# Patient Record
Sex: Male | Born: 1975
Health system: Southern US, Community
[De-identification: ages and names within clinical notes are randomized; demographics above are authoritative.]

## PROBLEM LIST (undated history)

## (undated) DIAGNOSIS — Z85850 Personal history of malignant neoplasm of thyroid: Secondary | ICD-10-CM

## (undated) DIAGNOSIS — F988 Other specified behavioral and emotional disorders with onset usually occurring in childhood and adolescence: Secondary | ICD-10-CM

## (undated) DIAGNOSIS — Z87891 Personal history of nicotine dependence: Secondary | ICD-10-CM

## (undated) DIAGNOSIS — E0580 Other thyrotoxicosis without thyrotoxic crisis or storm: Secondary | ICD-10-CM

## (undated) DIAGNOSIS — E781 Pure hyperglyceridemia: Secondary | ICD-10-CM

## (undated) HISTORY — DX: Personal history of nicotine dependence: Z87.891

## (undated) HISTORY — DX: Personal history of malignant neoplasm of thyroid: Z85.850

## (undated) HISTORY — DX: Other specified behavioral and emotional disorders with onset usually occurring in childhood and adolescence: F98.8

## (undated) HISTORY — DX: Other thyrotoxicosis without thyrotoxic crisis or storm: E05.80

---
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
## 2001-11-12 ENCOUNTER — Encounter: Payer: Self-pay | Admitting: Internal Medicine

## 2003-06-14 HISTORY — PX: THYROIDECTOMY: SHX17

## 2005-04-03 ENCOUNTER — Ambulatory Visit: Payer: Self-pay | Admitting: Internal Medicine

## 2005-05-22 ENCOUNTER — Ambulatory Visit: Payer: Self-pay | Admitting: Internal Medicine

## 2005-08-20 ENCOUNTER — Ambulatory Visit: Payer: Self-pay | Admitting: Internal Medicine

## 2006-03-05 ENCOUNTER — Ambulatory Visit: Payer: Self-pay | Admitting: Internal Medicine

## 2007-10-16 ENCOUNTER — Ambulatory Visit: Payer: Self-pay | Admitting: Internal Medicine

## 2007-10-16 DIAGNOSIS — Z85850 Personal history of malignant neoplasm of thyroid: Secondary | ICD-10-CM

## 2007-10-16 DIAGNOSIS — E039 Hypothyroidism, unspecified: Secondary | ICD-10-CM

## 2007-10-16 DIAGNOSIS — F988 Other specified behavioral and emotional disorders with onset usually occurring in childhood and adolescence: Secondary | ICD-10-CM

## 2007-12-02 ENCOUNTER — Ambulatory Visit: Payer: Self-pay | Admitting: Internal Medicine

## 2008-01-14 ENCOUNTER — Ambulatory Visit: Payer: Self-pay | Admitting: Internal Medicine

## 2008-03-29 ENCOUNTER — Ambulatory Visit: Payer: Self-pay | Admitting: Internal Medicine

## 2008-06-23 ENCOUNTER — Encounter: Payer: Self-pay | Admitting: Internal Medicine

## 2008-06-28 ENCOUNTER — Ambulatory Visit: Payer: Self-pay | Admitting: Internal Medicine

## 2008-07-28 ENCOUNTER — Ambulatory Visit: Payer: Self-pay | Admitting: Internal Medicine

## 2008-07-28 DIAGNOSIS — J069 Acute upper respiratory infection, unspecified: Secondary | ICD-10-CM | POA: Insufficient documentation

## 2008-10-08 ENCOUNTER — Ambulatory Visit: Payer: Self-pay | Admitting: Internal Medicine

## 2009-01-12 ENCOUNTER — Ambulatory Visit: Payer: Self-pay | Admitting: Internal Medicine

## 2009-01-13 ENCOUNTER — Telehealth (INDEPENDENT_AMBULATORY_CARE_PROVIDER_SITE_OTHER): Payer: Self-pay | Admitting: *Deleted

## 2009-04-14 ENCOUNTER — Ambulatory Visit: Payer: Self-pay | Admitting: Internal Medicine

## 2009-06-23 ENCOUNTER — Encounter: Payer: Self-pay | Admitting: Internal Medicine

## 2009-06-25 ENCOUNTER — Encounter: Payer: Self-pay | Admitting: Internal Medicine

## 2009-07-22 ENCOUNTER — Ambulatory Visit: Payer: Self-pay | Admitting: Internal Medicine

## 2009-08-22 ENCOUNTER — Telehealth: Payer: Self-pay | Admitting: Internal Medicine

## 2009-10-20 ENCOUNTER — Ambulatory Visit: Payer: Self-pay | Admitting: Internal Medicine

## 2009-10-21 ENCOUNTER — Telehealth: Payer: Self-pay | Admitting: Internal Medicine

## 2010-01-23 ENCOUNTER — Ambulatory Visit: Payer: Self-pay | Admitting: Internal Medicine

## 2010-03-27 ENCOUNTER — Ambulatory Visit: Payer: Self-pay | Admitting: Internal Medicine

## 2010-03-27 DIAGNOSIS — L0292 Furuncle, unspecified: Secondary | ICD-10-CM | POA: Insufficient documentation

## 2010-03-27 DIAGNOSIS — L0293 Carbuncle, unspecified: Secondary | ICD-10-CM

## 2010-06-27 ENCOUNTER — Ambulatory Visit: Payer: Self-pay | Admitting: Internal Medicine

## 2010-06-30 ENCOUNTER — Ambulatory Visit: Payer: Self-pay | Admitting: Diagnostic Radiology

## 2010-06-30 ENCOUNTER — Ambulatory Visit (HOSPITAL_BASED_OUTPATIENT_CLINIC_OR_DEPARTMENT_OTHER): Admission: RE | Admit: 2010-06-30 | Discharge: 2010-06-30 | Payer: Self-pay | Admitting: Internal Medicine

## 2010-06-30 ENCOUNTER — Encounter: Payer: Self-pay | Admitting: Internal Medicine

## 2010-06-30 LAB — CONVERTED CEMR LAB: Thyroglobulin Ab: <20 (ref <40.0)

## 2010-07-04 ENCOUNTER — Telehealth: Payer: Self-pay | Admitting: Internal Medicine

## 2010-09-12 NOTE — Assessment & Plan Note (Signed)
Summary: 3 month follow up/mhf   Vital Signs:  Patient profile:   35 year old male Height:      69 inches Weight:      190.75 pounds BMI:     28.27 O2 Sat:      99 % on Room air Temp:     98.2 degrees F oral Pulse rate:   67 / minute Resp:     16 per minute BP sitting:   114 / 70  (right arm) Cuff size:   large  Vitals Entered By: Glendell Docker CMA (June 27, 2010 8:04 AM)  O2 Flow:  Room air CC: 3 Month follow up Is Patient Diabetic? No Pain Assessment Patient in pain? no      Comments no concerns, refills on Adderral   Primary Care Provider:  DThomos Lemons DO  CC:  3 Month follow up.  History of Present Illness: 35 y/o male for ADD f/u no change  hx of thyroid ca - never established with Dr Allena Katz  pt due for surveillance no neck symptoms  Preventive Screening-Counseling & Management  Alcohol-Tobacco     Smoking Status: quit  Allergies (verified): No Known Drug Allergies  Past History:  Past Medical History: History of papillary thyroid cancer - S/P thyroidectomy 06/2003 and ablation Iatrogenic hypothyroidism   History of tobacco use.     ADD           Past Surgical History: Thyroidectomy 06/2003  Tonsillectomy 1985               Family History: Mother is age 29 - healthy Father is age 34 - achalasia CAD in paternal grandmother.           Social History: Occupation: Corporate investment banker - [employer]  Married 2 daughters ages 4 and 2   Former Smoker quit 7 yrs ago.  3 to 5 pack year history Alcohol use-no               Physical Exam  General:  alert, well-developed, and well-nourished.   Neck:  supple and no masses.   Lungs:  normal respiratory effort and normal breath sounds.   Heart:  normal rate, regular rhythm, and no gallop.     Impression & Recommendations:  Problem # 1:  ADD (ICD-314.00) Assessment Unchanged Maintain current medication regimen.  Problem # 2:  HYPOTHYROIDISM, IATROGENIC  (ICD-244.3)  His updated medication list for this problem includes:    Levothyroxine Sodium 150 Mcg Tabs (Levothyroxine sodium) ..... One by mouth once daily  Orders: T-TSH [phone]) T- * Misc. Laboratory test [phone]) Radiology Referral (Radiology)  Complete Medication List: 1)  Levothyroxine Sodium 150 Mcg Tabs (Levothyroxine sodium) .... One by mouth once daily 2)  Adderall Xr 20 Mg Xr24h-cap (Amphetamine-dextroamphetamine) .... One by mouth once daily 3)  Adderall Xr 20 Mg Xr24h-cap (Amphetamine-dextroamphetamine) .... One by mouth once daily (fill on or after 07/27/2010) 4)  Adderall Xr 20 Mg Xr24h-cap (Amphetamine-dextroamphetamine) .... One by mouth once daily (fill on or after 08/27/10)  Patient Instructions: 1)  Please schedule a follow-up appointment in 3 months (nurse visit) Prescriptions: LEVOTHYROXINE SODIUM 150 MCG TABS (LEVOTHYROXINE SODIUM) one by mouth once daily  #90 Tablet x 1   Entered and Authorized by:   D. Thomos Lemons DO   Signed by:   D. Thomos Lemons DO on 06/27/2010   Method used:   Electronically to        [facility]  [address] [phone][facility] (  retail)       [address] [place], [place]  [address]       Ph: [phone] or [phone]       Fax: [phone]   RxID:   [id number] ADDERALL XR 20 MG XR24H-CAP (AMPHETAMINE-DEXTROAMPHETAMINE) one by mouth once daily (fill on or after 08/27/10)  #30 x 0   Entered and Authorized by:   D. Thomos Lemons DO   Signed by:   D. Thomos Lemons DO on 06/27/2010   Method used:   Print then Give to Patient   RxID:   [id number] ADDERALL XR 20 MG XR24H-CAP (AMPHETAMINE-DEXTROAMPHETAMINE) one by mouth once daily (fill on or after 07/27/2010)  #30 x 0   Entered and Authorized by:   D. Thomos Lemons DO   Signed by:   D. Thomos Lemons DO on 06/27/2010   Method used:   Print then Give to Patient   RxID:   [id number] ADDERALL XR 20 MG XR24H-CAP (AMPHETAMINE-DEXTROAMPHETAMINE) one by mouth once daily  #30 x 0   Entered  and Authorized by:   D. Thomos Lemons DO   Signed by:   D. Thomos Lemons DO on 06/27/2010   Method used:   Print then Give to Patient   RxID:   [id number]    Orders Added: 1)  T-TSH [[id number]] 2)  T- * Misc. Laboratory test [phone] 3)  Radiology Referral [Radiology] 4)  Est. Patient Level III [[address]]     Current Allergies (reviewed today): No known allergies

## 2010-09-12 NOTE — Progress Notes (Signed)
Summary: lost his Adderall Rx's. and needs to pick up TODAY  Phone Note Refill Request    Follow-up for Phone Call        Pt. has lost his  Adderall prescriptions that Dr.Yoo gave him while he was here. He states he lost the ones from this month forward. Pt. would like to pick up a reprint today from our office before the weather gets bad, if you will call patient and let him know the status on this. Thank you [phone] Follow-up by: Michaelle Copas,  August 22, 2009 12:02 PM  Additional Follow-up for Phone Call Additional follow up Details #1::        please reprint for signature Additional Follow-up by: D. Thomos Lemons DO,  August 22, 2009 1:17 PM    Additional Follow-up for Phone Call Additional follow up Details #2::    rx printed x2 signed and left at fornt desk for patient pick up. Patient is aware Follow-up by: Glendell Docker CMA,  August 22, 2009 4:07 PM  Prescriptions: ADDERALL XR 20 MG XR24H-CAP (AMPHETAMINE-DEXTROAMPHETAMINE) one by mouth once daily (fill on or after 09/22/08)  #30 x 0   Entered by:   Glendell Docker CMA   Authorized by:   D. Thomos Lemons DO   Signed by:   Glendell Docker CMA on 08/22/2009   Method used:   Reprint   RxID:   [id number] ADDERALL XR 20 MG XR24H-CAP (AMPHETAMINE-DEXTROAMPHETAMINE) one by mouth once daily (fill on or after 08/22/2009)  #30 x 0   Entered by:   Glendell Docker CMA   Authorized by:   D. Thomos Lemons DO   Signed by:   Glendell Docker CMA on 08/22/2009   Method used:   Reprint   RxID:   [id number] ADDERALL XR 20 MG  CP24 (AMPHETAMINE-DEXTROAMPHETAMINE) one by mouth once daily  #30 x 0   Entered by:   Glendell Docker CMA   Authorized by:   D. Thomos Lemons DO   Signed by:   Glendell Docker CMA on 08/22/2009   Method used:   Reprint   RxID:   [id number]

## 2010-09-12 NOTE — Progress Notes (Signed)
Summary: lab result  Phone Note Outgoing Call   Summary of Call: call pt - thyroid u/s - no change  thyroid blood tests normal.  continue same dose of thyroid medication arrange repeat TSH in 6 months Initial call taken by: D. Thomos Lemons DO,  July 04, 2010 5:26 PM  Follow-up for Phone Call        Pt notified and voices understanding. Lab order placed and sent to the lab for the week of 12/24/09. Appt reminder and lab hours mailed to pt. Nicki Guadalajara Fergerson CMA Duncan Dull)  July 05, 2010 8:33 AM

## 2010-09-12 NOTE — Progress Notes (Signed)
Summary: Lab Results  Phone Note Outgoing Call   Summary of Call: call pt - TSH normal.  continue same dose of thyroid medication.  Plan is to repeat TSH in 6 months Initial call taken by: D. Thomos Lemons DO,  October 21, 2009 9:34 AM  Follow-up for Phone Call        patient advised per Dr Artist Pais instructions, request 24 to [facility] Follow-up by: Glendell Docker CMA,  October 21, 2009 9:39 AM    Prescriptions: LEVOTHYROXINE SODIUM 150 MCG TABS (LEVOTHYROXINE SODIUM) one by mouth once daily Brand medically necessary #90 x 1   Entered by:   Glendell Docker CMA   Authorized by:   D. Thomos Lemons DO   Signed by:   Glendell Docker CMA on 10/21/2009   Method used:   Electronically to        [facility]* (mail-order)             ,          Ph: [phone]       Fax: [phone]   RxID:   [id number]

## 2010-09-12 NOTE — Assessment & Plan Note (Signed)
Summary: 78mo. f/u - jr   Vital Signs:  Patient profile:   35 year old male Height:      69 inches Weight:      194.75 pounds BMI:     28.86 O2 Sat:      98 % on Room air Temp:     98.5 degrees F oral Pulse rate:   66 / minute Pulse rhythm:   regular Resp:     18 per minute BP sitting:   100 / 70  (right arm) Cuff size:   98.5large  Vitals Entered By: Glendell Docker CMA (October 20, 2009 8:07 AM)  O2 Flow:  Room air CC: Rm 3-  3 month follow up  Comments no concerns medication refill   Primary Care Provider:  DThomos Lemons DO  CC:  Rm 3-  3 month follow up .  History of Present Illness: 35 y/o white male for follow up. overall doing well no wt change no behavioral change medication not wearing off  Allergies (verified): No Known Drug Allergies  Past History:  Past Medical History: History of papillary thyroid cancer - S/P thyroidectomy 06/2003 and ablation Iatrogenic hypothyroidism History of tobacco use.   ADD          Past Surgical History: Thyroidectomy 06/2003  Tonsillectomy 1985          Family History: Mother is age 24 - healthy Father is age 3 - achalasia CAD in paternal grandmother.       Social History: Occupation: Corporate investment banker for [employer] Married 2 daughters ages 58 and 2   Former Smoker quit 7 yrs ago.  3 to 5 pack year history Alcohol use-no          Physical Exam  General:  alert, well-developed, and well-nourished.   Lungs:  normal respiratory effort and normal breath sounds.   Heart:  normal rate, regular rhythm, and no gallop.   Extremities:  No lower extremity edema    Impression & Recommendations:  Problem # 1:  ADD (ICD-314.00)  stable.  Maintain current medication regimen.  Problem # 2:  HYPOTHYROIDISM, IATROGENIC (ICD-244.3) check TSH.    His updated medication list for this problem includes:    Levothyroxine Sodium 150 Mcg Tabs (Levothyroxine sodium) ..... One by mouth once  daily  Orders: T-TSH [phone])  Problem # 3:  THYROID CANCER, HX OF (ICD-V10.87) History of papillary thyroid cancer - S/P thyroidectomy 06/2003 and ablation.  Prev followed by Dr. Roanna Raider.  refer to Dr. Allena Katz Orders: Endocrinology Referral (Endocrine)  Complete Medication List: 1)  Levothyroxine Sodium 150 Mcg Tabs (Levothyroxine sodium) .... One by mouth once daily 2)  Adderall Xr 20 Mg Cp24 (Amphetamine-dextroamphetamine) .... One by mouth once daily 3)  Adderall Xr 20 Mg Xr24h-cap (Amphetamine-dextroamphetamine) .... One by mouth once daily (fill on or after 11/20/2009) 4)  Adderall Xr 20 Mg Xr24h-cap (Amphetamine-dextroamphetamine) .... One by mouth once daily (fill on or after 12/20/08)  Patient Instructions: 1)  Please schedule a follow-up appointment in 3 months. Prescriptions: ADDERALL XR 20 MG XR24H-CAP (AMPHETAMINE-DEXTROAMPHETAMINE) one by mouth once daily (fill on or after 12/20/08)  #30 x 0   Entered and Authorized by:   D. Thomos Lemons DO   Signed by:   D. Thomos Lemons DO on 10/20/2009   Method used:   Print then Give to Patient   RxID:   [id number] ADDERALL XR 20 MG XR24H-CAP (AMPHETAMINE-DEXTROAMPHETAMINE) one by mouth once daily (fill on or after 11/20/2009)  #  30 x 0   Entered and Authorized by:   D. Thomos Lemons DO   Signed by:   D. Thomos Lemons DO on 10/20/2009   Method used:   Print then Give to Patient   RxID:   [id number] ADDERALL XR 20 MG  CP24 (AMPHETAMINE-DEXTROAMPHETAMINE) one by mouth once daily  #30 x 0   Entered and Authorized by:   D. Thomos Lemons DO   Signed by:   D. Thomos Lemons DO on 10/20/2009   Method used:   Print then Give to Patient   RxID:   [id number]   Current Allergies (reviewed today): No known allergies

## 2010-09-12 NOTE — Assessment & Plan Note (Signed)
Summary: place on left thigh/dt   Vital Signs:  Patient profile:   35 year old male Height:      69 inches Weight:      198 pounds BMI:     29.35 O2 Sat:      100 % on Room air Temp:     98.4 degrees F oral Pulse rate:   52 / minute Pulse rhythm:   regular Resp:     16 per minute BP sitting:   110 / 70  (right arm) Cuff size:   large  Vitals Entered By: Glendell Docker CMA (March 27, 2010 3:53 PM)  O2 Flow:  Room air CC: Bump inner left thigh Is Patient Diabetic? No Pain Assessment Patient in pain? no        Primary Care Provider:  Dondra Spry DO  CC:  Bump inner left thigh.  History of Present Illness: 35 y/o white male with hx of ADD c/o of bump on inner right thigh, noticed this morning, denies pain, irritated from clothing, no drainage.  no fever  ADD - stable.  no change  Preventive Screening-Counseling & Management  Alcohol-Tobacco     Smoking Status: quit  Allergies (verified): No Known Drug Allergies  Past History:  Past Medical History: History of papillary thyroid cancer - S/P thyroidectomy 06/2003 and ablation Iatrogenic hypothyroidism   History of tobacco use.    ADD           Past Surgical History: Thyroidectomy 06/2003  Tonsillectomy 1985              Social History: Smoking Status:  quit  Physical Exam  General:  alert, well-developed, and well-nourished.   Lungs:  normal respiratory effort and normal breath sounds.   Heart:  normal rate, regular rhythm, and no gallop.   Skin:  1 cm boil on right upper inner thigh   Impression & Recommendations:  Problem # 1:  CARBUNCLE AND FURUNCLE OF UNSPECIFIED SITE (ICD-680.9) 1 cm boil right upper thigh.  not ready for I & D.  use warm compress and abx.  doxy 100 mg two times a day x 10 days try to encourage spontaneous drainage. Patient advised to call office if symptoms persist or worsen.  I & D may be necessary  Problem # 2:  ADD (ICD-314.00) Assessment: Unchanged  Complete  Medication List: 1)  Levothyroxine Sodium 150 Mcg Tabs (Levothyroxine sodium) .... One by mouth once daily 2)  Adderall Xr 20 Mg Xr24h-cap (Amphetamine-dextroamphetamine) .... One by mouth once daily 3)  Adderall Xr 20 Mg Xr24h-cap (Amphetamine-dextroamphetamine) .... One by mouth once daily (fill on or after 04/27/2010) 4)  Adderall Xr 20 Mg Xr24h-cap (Amphetamine-dextroamphetamine) .... One by mouth once daily (fill on or after 05/27/10) 5)  Doxycycline Hyclate 100 Mg Tabs (Doxycycline hyclate) .... One by mouth two times a day  Patient Instructions: 1)  Please schedule a follow-up appointment in 3 months. Prescriptions: ADDERALL XR 20 MG XR24H-CAP (AMPHETAMINE-DEXTROAMPHETAMINE) one by mouth once daily (fill on or after 05/27/10)  #30 x 0   Entered and Authorized by:   D. Thomos Lemons DO   Signed by:   D. Thomos Lemons DO on 03/27/2010   Method used:   Print then Give to Patient   RxID:   [id number] ADDERALL XR 20 MG XR24H-CAP (AMPHETAMINE-DEXTROAMPHETAMINE) one by mouth once daily (fill on or after 04/27/2010)  #30 x 0   Entered and Authorized by:   D. Thomos Lemons DO   Signed  by:   Dondra Spry DO on 03/27/2010   Method used:   Print then Give to Patient   RxID:   [id number] ADDERALL XR 20 MG XR24H-CAP (AMPHETAMINE-DEXTROAMPHETAMINE) one by mouth once daily  #30 x 0   Entered and Authorized by:   D. Thomos Lemons DO   Signed by:   D. Thomos Lemons DO on 03/27/2010   Method used:   Print then Give to Patient   RxID:   [id number] DOXYCYCLINE HYCLATE 100 MG TABS (DOXYCYCLINE HYCLATE) one by mouth two times a day  #20 x 0   Entered and Authorized by:   D. Thomos Lemons DO   Signed by:   D. Thomos Lemons DO on 03/27/2010   Method used:   [facility]        [facility]  [address] [id number]       [address]       [place], [place]  [address]       Ph: [phone] or [phone]       Fax: [phone]   RxID:   [id number]   Current Allergies (reviewed today): No  known allergies

## 2010-09-12 NOTE — Assessment & Plan Note (Signed)
Summary: ADDERALL REFILL/MHF--Rm 2   Vital Signs:  Patient profile:   35 year old male Height:      69 inches Weight:      197.50 pounds BMI:     29.27 Temp:     98.2 degrees F oral Pulse rate:   56 / minute Pulse rhythm:   regular Resp:     18 per minute BP sitting:   110 / 78  (right arm) Cuff size:   regular  Vitals Entered By: Mervin Kung CMA (January 23, 2010 3:54 PM) CC: Room 2  Follow up for management of ADD. Is Patient Diabetic? No   Primary Care Provider:  Dondra Spry DO  CC:  Room 2  Follow up for management of ADD.Marland Kitchen  History of Present Illness: 35 y/o for ADD no sign change doing well  he has not est with with endo re:  hx of thyroid cancer   Allergies (verified): No Known Drug Allergies  Past History:  Past Medical History: History of papillary thyroid cancer - S/P thyroidectomy 06/2003 and ablation Iatrogenic hypothyroidism  History of tobacco use.    ADD           Past Surgical History: Thyroidectomy 06/2003  Tonsillectomy 1985             Family History: Mother is age 2 - healthy Father is age 20 - achalasia CAD in paternal grandmother.          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  3 to 5 pack year history Alcohol use-no              Physical Exam  General:  alert, well-developed, and well-nourished.   Neck:  supple and no masses.   Lungs:  normal respiratory effort and normal breath sounds.   Heart:  normal rate, regular rhythm, and no gallop.   Neurologic:  cranial nerves II-XII intact and gait normal.   Psych:  normally interactive and good eye contact.     Impression & Recommendations:  Problem # 1:  ADD (ICD-314.00) Assessment Unchanged stable.  Maintain current medication regimen.  Problem # 2:  HYPOTHYROIDISM, IATROGENIC (ICD-244.3) follow TFTs, thyroglobulin and thyroid u/s until pt est with new endo His  updated medication list for this problem includes:    Levothyroxine Sodium 150 Mcg Tabs (Levothyroxine sodium) ..... One by mouth once daily  Labs Reviewed: TSH: 0.635 (10/20/2009)     Complete Medication List: 1)  Levothyroxine Sodium 150 Mcg Tabs (Levothyroxine sodium) .... One by mouth once daily 2)  Adderall Xr 20 Mg Xr24h-cap (Amphetamine-dextroamphetamine) .... One by mouth once daily 3)  Adderall Xr 20 Mg Xr24h-cap (Amphetamine-dextroamphetamine) .... One by mouth once daily (fill on or after 02/22/2010) 4)  Adderall Xr 20 Mg Xr24h-cap (Amphetamine-dextroamphetamine) .... One by mouth once daily (fill on or after 03/25/10)  Patient Instructions: 1)  Please schedule a follow-up appointment in 3 months. 2)  TSH:  244.9 3)  Thyroglobulin level:  V10.87 4)  Please return for lab work one (1) week before your next appointment.  Prescriptions: ADDERALL XR 20 MG XR24H-CAP (AMPHETAMINE-DEXTROAMPHETAMINE) one by mouth once daily (fill on or after 03/25/10)  #30 x 0   Entered and Authorized by:   D. Thomos Lemons DO   Signed by:   D. Thomos Lemons DO on 01/23/2010   Method used:   Print then Give to  Patient   RxID:   [id number] ADDERALL XR 20 MG XR24H-CAP (AMPHETAMINE-DEXTROAMPHETAMINE) one by mouth once daily (fill on or after 02/22/2010)  #30 x 0   Entered and Authorized by:   D. Thomos Lemons DO   Signed by:   D. Thomos Lemons DO on 01/23/2010   Method used:   Print then Give to Patient   RxID:   [id number] ADDERALL XR 20 MG XR24H-CAP (AMPHETAMINE-DEXTROAMPHETAMINE) one by mouth once daily  #30 x 0   Entered and Authorized by:   D. Thomos Lemons DO   Signed by:   D. Thomos Lemons DO on 01/23/2010   Method used:   Print then Give to Patient   RxID:   [id number]   Current Allergies (reviewed today): No known allergies

## 2010-10-04 ENCOUNTER — Ambulatory Visit (INDEPENDENT_AMBULATORY_CARE_PROVIDER_SITE_OTHER): Payer: Private Health Insurance - Indemnity

## 2010-10-04 ENCOUNTER — Encounter: Payer: Self-pay | Admitting: Internal Medicine

## 2010-10-10 NOTE — Assessment & Plan Note (Signed)
Summary: 3 mth follow up- per pt instructions in emr/ss   Vital Signs:  Patient profile:   35 year old male Height:      69 inches Weight:      196.75 pounds BMI:     29.16 Temp:     97.9 degrees F oral Pulse rate:   54 / minute Resp:     18 per minute BP sitting:   130 / 80  (left arm) Cuff size:   large  Vitals Entered By: Glendell Docker CMA (October 04, 2010 9:03 AM) CC: Medication Refill   CC:  Medication Refill.  Allergies: No Known Drug Allergies   Complete Medication List: 1)  Levothyroxine Sodium 150 Mcg Tabs (Levothyroxine sodium) .... One by mouth once daily 2)  Adderall Xr 20 Mg Xr24h-cap (Amphetamine-dextroamphetamine) .... Take 1 tablet by mouth once a day 3)  Adderall Xr 20 Mg Xr24h-cap (Amphetamine-dextroamphetamine) .... One by mouth once daily (fill on or after 11/02/2010) 4)  Adderall Xr 20 Mg Xr24h-cap (Amphetamine-dextroamphetamine) .... One by mouth once daily (fill on or after 12/03/10) Prescriptions: ADDERALL XR 20 MG XR24H-CAP (AMPHETAMINE-DEXTROAMPHETAMINE) one by mouth once daily (fill on or after 11/02/2010)  #30 x 0   Entered by:   Glendell Docker CMA   Authorized by:   D. Thomos Lemons DO   Signed by:   Glendell Docker CMA on 10/04/2010   Method used:   Print then Give to Patient   RxID:   [id number] ADDERALL XR 20 MG XR24H-CAP (AMPHETAMINE-DEXTROAMPHETAMINE) one by mouth once daily (fill on or after 12/03/10)  #30 x 0   Entered by:   Glendell Docker CMA   Authorized by:   D. Thomos Lemons DO   Signed by:   Glendell Docker CMA on 10/04/2010   Method used:   Print then Give to Patient   RxID:   [id number] ADDERALL XR 20 MG XR24H-CAP (AMPHETAMINE-DEXTROAMPHETAMINE) Take 1 tablet by mouth once a day  #30 x 0   Entered by:   Glendell Docker CMA   Authorized by:   D. Thomos Lemons DO   Signed by:   Glendell Docker CMA on 10/04/2010   Method used:   Print then Give to Patient   RxID:   [id number]

## 2010-10-23 ENCOUNTER — Telehealth: Payer: Self-pay | Admitting: Internal Medicine

## 2010-10-31 NOTE — Progress Notes (Signed)
Summary: refill-synthroid  Phone Note Refill Request Message from:  Fax from Pharmacy on October 23, 2010 2:59 PM  Refills Requested: Medication #1:  synthroid 150 mcg   Brand Name Necessary? No   Supply Requested: [facility] [address] [place], [address] fax [phone]   Method Requested: Electronic Next Appointment Scheduled: 5.3.12 Initial call taken by: Elba Barman,  October 23, 2010 3:00 PM  Follow-up for Phone Call        Rx completed in Dr. Tiajuana Amass Follow-up by: Glendell Docker CMA,  October 23, 2010 3:37 PM    Prescriptions: LEVOTHYROXINE SODIUM 150 MCG TABS (LEVOTHYROXINE SODIUM) one by mouth once daily  #30 x 1   Entered by:   Glendell Docker CMA   Authorized by:   D. Thomos Lemons DO   Signed by:   Glendell Docker CMA on 10/23/2010   Method used:   Electronically to        [facility] [phone]* (retail)       [address] [place], [place]  [address]       Ph: [phone]       Fax: [phone]   RxID:   [id number]

## 2010-12-13 ENCOUNTER — Encounter: Payer: Self-pay | Admitting: Internal Medicine

## 2010-12-14 ENCOUNTER — Encounter: Payer: Self-pay | Admitting: Internal Medicine

## 2010-12-14 ENCOUNTER — Ambulatory Visit (INDEPENDENT_AMBULATORY_CARE_PROVIDER_SITE_OTHER): Payer: Private Health Insurance - Indemnity | Admitting: Internal Medicine

## 2010-12-14 DIAGNOSIS — E032 Hypothyroidism due to medicaments and other exogenous substances: Secondary | ICD-10-CM

## 2010-12-14 DIAGNOSIS — F988 Other specified behavioral and emotional disorders with onset usually occurring in childhood and adolescence: Secondary | ICD-10-CM

## 2010-12-14 DIAGNOSIS — Z85850 Personal history of malignant neoplasm of thyroid: Secondary | ICD-10-CM

## 2010-12-14 MED ORDER — AMPHETAMINE-DEXTROAMPHET ER 20 MG PO CP24: ORAL_CAPSULE | ORAL | Status: DC

## 2010-12-14 MED ORDER — AMPHETAMINE-DEXTROAMPHET ER 20 MG PO CP24: 20.0000 mg | ORAL_CAPSULE | Freq: Every day | ORAL | Status: DC

## 2010-12-14 MED ORDER — LEVOTHYROXINE SODIUM 150 MCG PO TABS: 150.0000 ug | ORAL_TABLET | Freq: Every day | ORAL | Status: DC

## 2010-12-14 NOTE — Progress Notes (Signed)
  Subjective:    Patient ID: Zachary [name], male    DOB: 29-Feb-1976, 35 y.o.   MRN: [id number]  HPI  35 y/o white male for follow up re:  ADD No wt change No issues with medication  Hx of thyroid cancer - has not established with Dr. Allena Katz Pt due for TFTs    Review of Systems  Past Medical History  Diagnosis Date  . History of papillary adenocarcinoma of thyroid     s/p thyroidectomy 06/2003  . Iatrogenic hyperthyroidism   . History of tobacco abuse   . ADD (attention deficit disorder)     History   Social History  . Marital Status: Married    Spouse Name: N/A    Number of Children: N/A  . Years of Education: N/A   Occupational History  . Not on file.   Social History Main Topics  . Smoking status: Former Games developer  . Smokeless tobacco: Not on file   Comment: Quit  about 7 years ago. 3-5 pack year history  . Alcohol Use: No  . Drug Use: Not on file  . Sexually Active: Not on file   Other Topics Concern  . 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  3 to 5 pack year historyAlcohol use-no          Past Surgical History  Procedure Date  . Thyroidectomy 06/2003  . Tonsillectomy 1985    Family History  Problem Relation Age of Onset  . Achalasia Father 11  . Coronary artery disease Paternal Grandmother   . Other Mother 24    healthy    No Known Allergies  Current Outpatient Prescriptions on File Prior to Visit  Medication Sig Dispense Refill  . DISCONTD: amphetamine-dextroamphetamine (ADDERALL XR) 20 MG 24 hr capsule Take 20 mg by mouth daily.        Marland Kitchen DISCONTD: amphetamine-dextroamphetamine (ADDERALL XR) 20 MG 24 hr capsule Take 20 mg by mouth daily.        Marland Kitchen DISCONTD: amphetamine-dextroamphetamine (ADDERALL XR) 20 MG 24 hr capsule Take 20 mg by mouth daily.        Marland Kitchen DISCONTD: levothyroxine (SYNTHROID, LEVOTHROID) 150 MCG tablet Take  150 mcg by mouth daily.          BP 100/70  Pulse 55  Temp(Src) 98.3 F (36.8 C) (Oral)  Resp 16  Wt 195 lb (88.451 kg)  SpO2 100%       Objective:   Physical Exam  Constitutional: He appears well-developed and well-nourished.  Cardiovascular: Normal rate, regular rhythm and normal heart sounds.   Pulmonary/Chest: Effort normal. He has no rales.  Psychiatric: He has a normal mood and affect. His behavior is normal.          Assessment & Plan:

## 2010-12-14 NOTE — Assessment & Plan Note (Signed)
Monitor TFTS and thyroglobulin level

## 2010-12-14 NOTE — Assessment & Plan Note (Signed)
No change in meds Wt and BP stable.

## 2010-12-14 NOTE — Patient Instructions (Addendum)
Please complete the following lab tests before your next follow up appointment: TSH,  Thyroglobulin level - 244.9,  V10.87

## 2011-03-15 ENCOUNTER — Telehealth: Payer: Self-pay | Admitting: *Deleted

## 2011-03-15 ENCOUNTER — Telehealth: Payer: Self-pay | Admitting: Internal Medicine

## 2011-03-15 DIAGNOSIS — F988 Other specified behavioral and emotional disorders with onset usually occurring in childhood and adolescence: Secondary | ICD-10-CM

## 2011-03-15 MED ORDER — AMPHETAMINE-DEXTROAMPHET ER 20 MG PO CP24: 20.0000 mg | ORAL_CAPSULE | ORAL | Status: DC

## 2011-03-15 NOTE — Telephone Encounter (Signed)
Left detailed message for pt to pick up rx at front desk and notified Boneta Lucks at [facility] that pt will need to bring in a hard script.

## 2011-03-15 NOTE — Telephone Encounter (Signed)
Rx printed and forwarded to Provider for signature. 

## 2011-03-15 NOTE — Telephone Encounter (Signed)
Pt is out and needs to pick up refill today. Please advise.

## 2011-03-15 NOTE — Telephone Encounter (Signed)
See previous phone note of 03/15/11.

## 2011-03-15 NOTE — Telephone Encounter (Signed)
Pt received Adderall XR rxs from [place] on 12/14/10 through 02/2011. Received message from [facility] pharmacy that they will be unable to fill rx that says to fill on or after 03/05/11 because there were no specific directions listed on the Rx.  He states that they can fill the Rx if the doctor talks to the pharmacist himself and verifies directions or we will need to re-issue the prescription to the pt and have him pick up a new one with specific directions. If you would like to speak to the pharmacist, please ask for Boneta Lucks. Please advise.

## 2011-03-15 NOTE — Telephone Encounter (Signed)
Ok to print

## 2011-03-15 NOTE — Telephone Encounter (Signed)
Refill- amphetamine-dextroamphetamine oral capsule extended release 24 hr 20mg . Take one capsule by mouth daily. Qty 30.   Comments: patient has prescription for generic adderall xr 20mg  from Dr. Artist Pais written 5.3.12. There are no directions on the hard copy and therefore can't be filled as written. Due to the class of medication, directions can be added if verbally given by the prescribing physician to the filling pharmacist(law prohibits representatives of either). If the prescriber is unable to give them verbally, then a new prescription will need to be given to the patient with correct directions. Patient is out of the medication and needs it today.

## 2011-03-16 ENCOUNTER — Ambulatory Visit: Payer: Private Health Insurance - Indemnity | Admitting: Internal Medicine

## 2011-03-19 ENCOUNTER — Ambulatory Visit: Payer: Private Health Insurance - Indemnity | Admitting: Family

## 2011-03-23 ENCOUNTER — Ambulatory Visit: Payer: Private Health Insurance - Indemnity | Admitting: Family

## 2011-03-23 ENCOUNTER — Ambulatory Visit (INDEPENDENT_AMBULATORY_CARE_PROVIDER_SITE_OTHER): Payer: Private Health Insurance - Indemnity | Admitting: Family

## 2011-03-23 ENCOUNTER — Encounter: Payer: Self-pay | Admitting: Family

## 2011-03-23 VITALS — BP 124/82 | HR 66 | Temp 97.9°F | Resp 16 | Ht 69.02 in | Wt 192.1 lb

## 2011-03-23 DIAGNOSIS — E039 Hypothyroidism, unspecified: Secondary | ICD-10-CM

## 2011-03-23 DIAGNOSIS — Z85850 Personal history of malignant neoplasm of thyroid: Secondary | ICD-10-CM

## 2011-03-23 DIAGNOSIS — F988 Other specified behavioral and emotional disorders with onset usually occurring in childhood and adolescence: Secondary | ICD-10-CM

## 2011-03-23 MED ORDER — AMPHETAMINE-DEXTROAMPHET ER 20 MG PO CP24: ORAL_CAPSULE | ORAL | Status: DC

## 2011-03-23 NOTE — Progress Notes (Signed)
  Subjective:    Patient ID: Zachary [name], male    DOB: February 18, 1976, 35 y.o.   MRN: [id number]  HPI  Zachary [name] is a 35 yr old male who presents today for follow up.    ADD-  Has been treated for the last few years with Adderal, feels that this is helping.  Thyroid cancer- this was diagnosed in 2004.  S/p thyroidectomy.  He has seen Endocrinology in the past - last visit was 1 yr ago.      Review of Systems    see HPI  Past Medical History  Diagnosis Date  . History of papillary adenocarcinoma of thyroid     s/p thyroidectomy 06/2003  . Iatrogenic hyperthyroidism   . History of tobacco abuse   . ADD (attention deficit disorder)     History   Social History  . Marital Status: Married    Spouse Name: N/A    Number of Children: N/A  . Years of Education: N/A   Occupational History  . Not on file.   Social History Main Topics  . Smoking status: Former Games developer  . Smokeless tobacco: Not on file   Comment: Quit  about 7 years ago. 3-5 pack year history  . Alcohol Use: No  . Drug Use: Not on file  . Sexually Active: Not on file   Other Topics Concern  . 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  3 to 5 pack year historyAlcohol use-no          Past Surgical History  Procedure Date  . Thyroidectomy 06/2003  . Tonsillectomy 1985    Family History  Problem Relation Age of Onset  . Achalasia Father 57  . Coronary artery disease Paternal Grandmother   . Other Mother 52    healthy    No Known Allergies  Current Outpatient Prescriptions on File Prior to Visit  Medication Sig Dispense Refill  . levothyroxine (SYNTHROID, LEVOTHROID) 150 MCG tablet Take 1 tablet (150 mcg total) by mouth daily.  90 tablet  1    BP 124/82  Pulse 66  Temp(Src) 97.9 F (36.6 C) (Oral)  Resp 16  Ht 5' 9.02" (1.753 m)  Wt 192 lb 1.3 oz (87.127 kg)  BMI 28.35  kg/m2    Objective:   Physical Exam  Constitutional: He appears well-developed and well-nourished.  HENT:  Head: Normocephalic and atraumatic.  Neck: Neck supple. No thyromegaly present.  Cardiovascular: Normal rate and regular rhythm.   Pulmonary/Chest: Effort normal and breath sounds normal.  Lymphadenopathy:    He has no cervical adenopathy.  Psychiatric: He has a normal mood and affect. His behavior is normal. Judgment and thought content normal.          Assessment & Plan:

## 2011-03-23 NOTE — Patient Instructions (Signed)
Please complete your lab work on the first floor.  You will be contacted about your referral to endocrinology. Follow up in 6 months.

## 2011-03-25 ENCOUNTER — Encounter: Payer: Self-pay | Admitting: Family

## 2011-03-26 NOTE — Assessment & Plan Note (Signed)
TSH stable.  Continue current dose of synthroid.  Will arrange consultation with endocrinology for monitoring.  Pt is agreeable.

## 2011-03-26 NOTE — Assessment & Plan Note (Signed)
Stable, continue current dose of adderall. 

## 2011-04-13 ENCOUNTER — Telehealth: Payer: Self-pay | Admitting: Internal Medicine

## 2011-04-13 MED ORDER — AMPHETAMINE-DEXTROAMPHET ER 20 MG PO CP24: 20.0000 mg | ORAL_CAPSULE | ORAL | Status: DC

## 2011-04-13 NOTE — Telephone Encounter (Signed)
Call placed to patient at [phone], no answer. A detailed voice message was left informing patient Rx ready for pick up.

## 2011-04-13 NOTE — Telephone Encounter (Signed)
Received message from [facility] pharmacy at 12:46pm stating pt had brought Adderall XR prescription dated 03/23/11 without directions and Sig: says fill on or after 01/14/11.  Per previous phone note below, corrected Rx was printed, signed and left at front desk today. Verified with front desk staff that pt picked up Rx around 12pm and filled at [facility] pharmacy downstairs. Verified with Pam at [facility] [phone] that pt did fill Adderall XR prescription dated today. Called [facility], [phone] and spoke to Hubbard. Instructed him to cancel rx dated 03/23/11 as pt has already filled Rx at a different pharmacy.

## 2011-04-13 NOTE — Telephone Encounter (Signed)
Patient returned phone call and I told him that rx was ready for pick up.

## 2011-04-13 NOTE — Telephone Encounter (Signed)
ADDERALL XR 20 MG TAKE 1 PER DAY   NEEDS TO PICK UP RX

## 2011-04-13 NOTE — Telephone Encounter (Signed)
Rx ready for pick up. Pls notify pt.

## 2011-05-14 ENCOUNTER — Telehealth: Payer: Self-pay | Admitting: Family

## 2011-05-15 MED ORDER — AMPHETAMINE-DEXTROAMPHET ER 20 MG PO CP24: 20.0000 mg | ORAL_CAPSULE | ORAL | Status: DC

## 2011-05-15 NOTE — Telephone Encounter (Signed)
Rx signed and placed at front desk for pt to pick up. Notified pt.

## 2011-05-15 NOTE — Telephone Encounter (Signed)
Rx printed and forwarded to Provider for signature. 

## 2011-06-19 ENCOUNTER — Telehealth: Payer: Self-pay | Admitting: Family

## 2011-06-19 MED ORDER — AMPHETAMINE-DEXTROAMPHET ER 20 MG PO CP24: 20.0000 mg | ORAL_CAPSULE | ORAL | Status: DC

## 2011-06-19 NOTE — Telephone Encounter (Signed)
Needs to pick up his adderall xr 20mg  rx

## 2011-06-19 NOTE — Telephone Encounter (Signed)
Rx printed and forwarded to Provider for signature. 

## 2011-06-20 NOTE — Telephone Encounter (Signed)
Left message on cell that rx is at front desk for pick up and to call if any questions. 

## 2011-07-02 ENCOUNTER — Telehealth: Payer: Self-pay | Admitting: Family

## 2011-07-02 DIAGNOSIS — E032 Hypothyroidism due to medicaments and other exogenous substances: Secondary | ICD-10-CM

## 2011-07-02 MED ORDER — LEVOTHYROXINE SODIUM 150 MCG PO TABS: 150.0000 ug | ORAL_TABLET | Freq: Every day | ORAL | Status: DC

## 2011-07-02 NOTE — Telephone Encounter (Signed)
Refill sent to pharmacy.   

## 2011-07-02 NOTE — Telephone Encounter (Signed)
Refill-synthroid 150 mcg udtab abbo. Take one tablet by mouth once daily. Qty 90. Last fill 10.14.12

## 2011-07-20 ENCOUNTER — Telehealth: Payer: Self-pay | Admitting: Family

## 2011-07-20 MED ORDER — AMPHETAMINE-DEXTROAMPHET ER 20 MG PO CP24: 20.0000 mg | ORAL_CAPSULE | ORAL | Status: DC

## 2011-07-20 NOTE — Telephone Encounter (Signed)
Rx printed and forwarded to Provider for signature/approval. Pt last seen in August and has f/u in February. Med last filled 06/19/11.

## 2011-07-20 NOTE — Telephone Encounter (Signed)
rx has been signed.  

## 2011-07-20 NOTE — Telephone Encounter (Signed)
Left message that Rx is ready for pick up at front desk.

## 2011-07-20 NOTE — Telephone Encounter (Signed)
Patient states that he needs a refill for generic adderrall.

## 2011-08-27 ENCOUNTER — Telehealth: Payer: Self-pay | Admitting: *Deleted

## 2011-08-27 MED ORDER — AMPHETAMINE-DEXTROAMPHET ER 20 MG PO CP24: 20.0000 mg | ORAL_CAPSULE | ORAL | Status: DC

## 2011-08-27 NOTE — Telephone Encounter (Signed)
Pt notified and rx left at front desk for pick up. 

## 2011-08-27 NOTE — Telephone Encounter (Signed)
Received request from pt for refill of Adderall. Last filled 07/20/11. Pt has f/u 09/21/11. Rx printed and forwarded to Provider for signature.

## 2011-09-21 ENCOUNTER — Ambulatory Visit: Payer: Private Health Insurance - Indemnity | Admitting: Family

## 2011-09-21 DIAGNOSIS — Z0289 Encounter for other administrative examinations: Secondary | ICD-10-CM

## 2011-09-27 ENCOUNTER — Telehealth: Payer: Self-pay | Admitting: Family

## 2011-09-27 MED ORDER — AMPHETAMINE-DEXTROAMPHET ER 20 MG PO CP24: 20.0000 mg | ORAL_CAPSULE | ORAL | Status: DC

## 2011-09-27 NOTE — Telephone Encounter (Signed)
Call placed to patient at [phone], no answer. A voice message was left for patient informing patient, Rx available for pick up.

## 2011-09-27 NOTE — Telephone Encounter (Signed)
Pt last picked up written Rx on 08/27/11. Rx printed and forwarded to Dr Rodena Medin for signature.

## 2011-09-27 NOTE — Telephone Encounter (Signed)
Patient would like to pu Adderal Rx, please call when ready. Thanks

## 2011-09-28 NOTE — Telephone Encounter (Signed)
Patient stopped by office this am and has picked up Rx.

## 2011-10-25 IMAGING — US US SOFT TISSUE HEAD/NECK
1 series · 14 of 25 positions shown · non-contrast
Comparison: None.

CLINICAL DATA: Follow-up thyroid cancer, thyroidectomy 0449.
Iatrogenic hypothyroidism.

THYROID ULTRASOUND
TECHNIQUE: Ultrasound examination of the thyroid gland and adjacent
soft tissues was performed.

[Series 1: us soft tissue head/neck · 0.08mm/px · 14 of 26 slices shown]
[im 1/26]
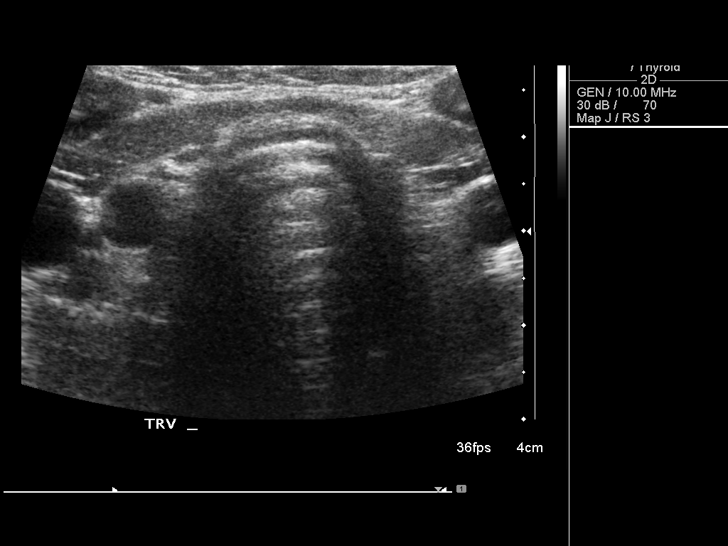
[im 3/26]
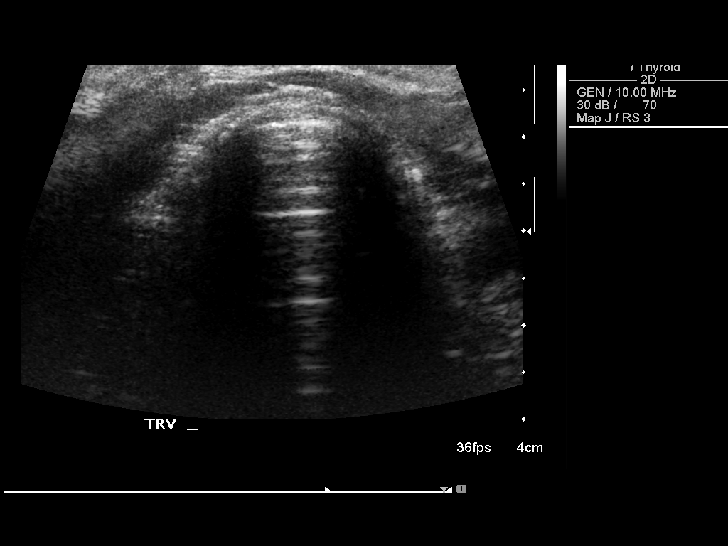
[im 5/26]
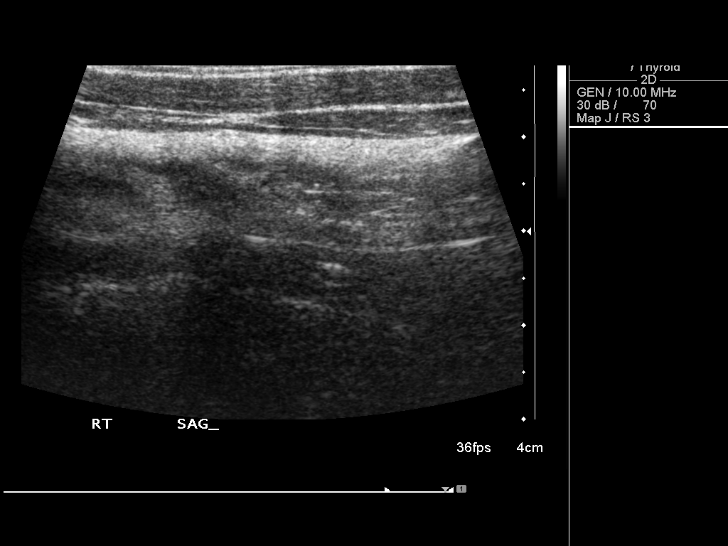
[im 7/26]
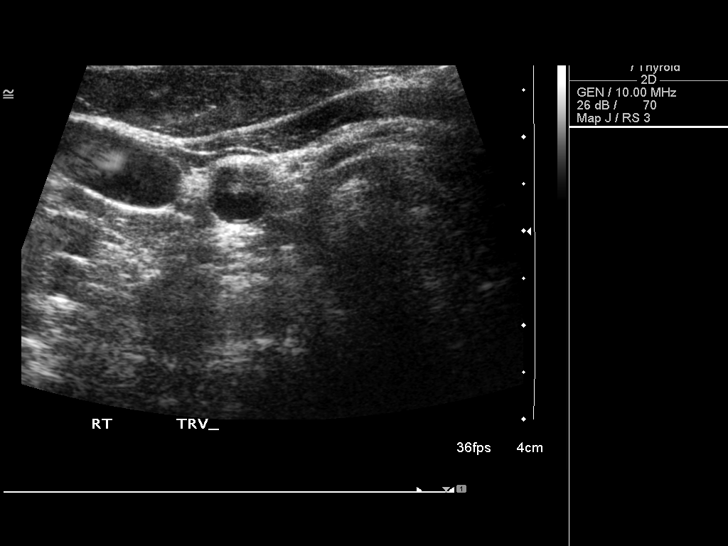
[im 9/26]
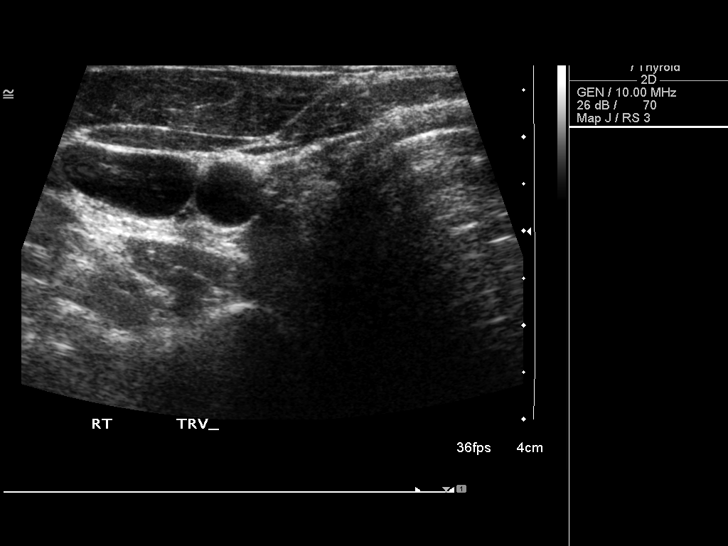
[im 10/26]
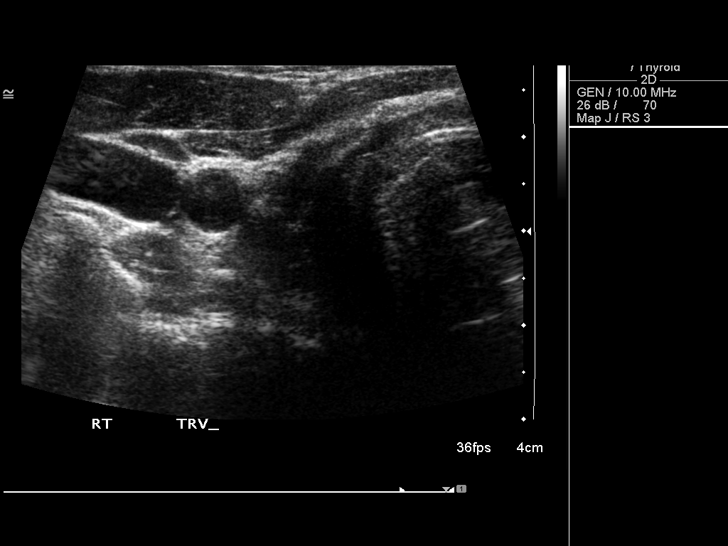
[im 12/26]
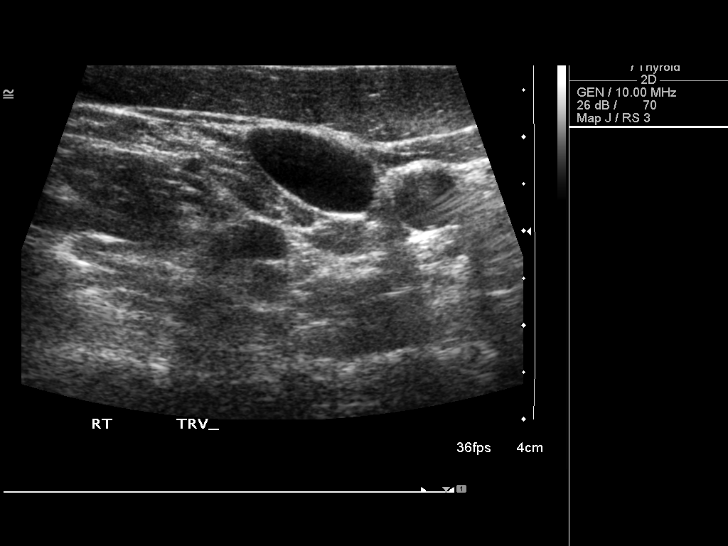
[im 14/26]
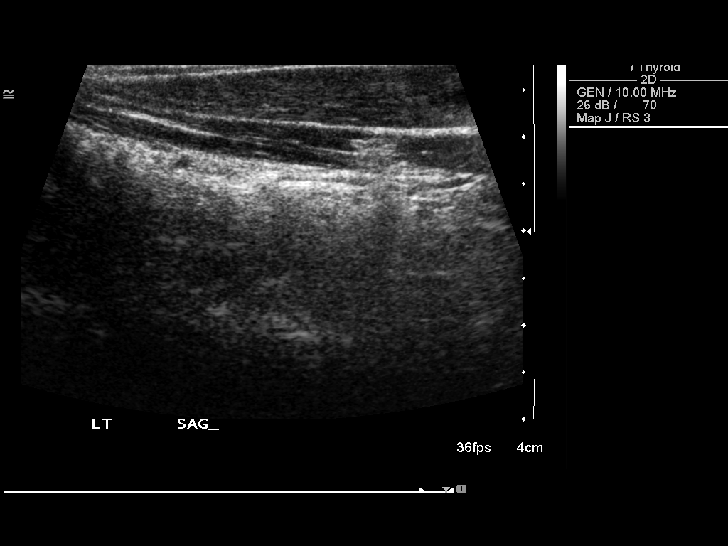
[im 16/26]
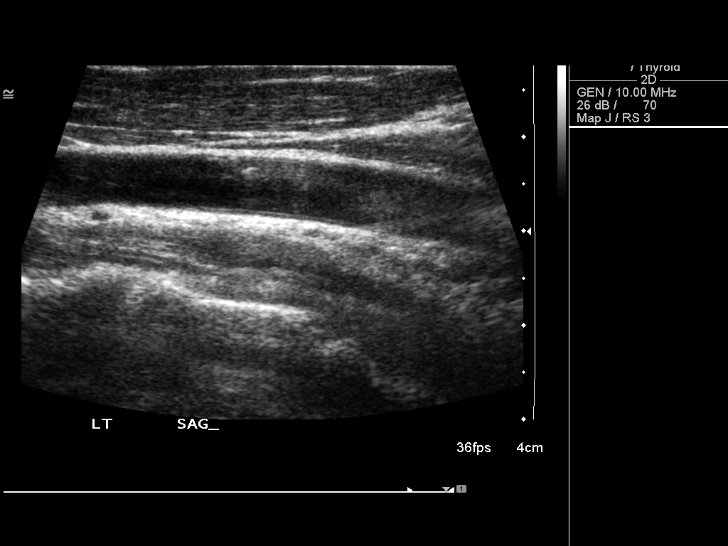
[im 17/26]
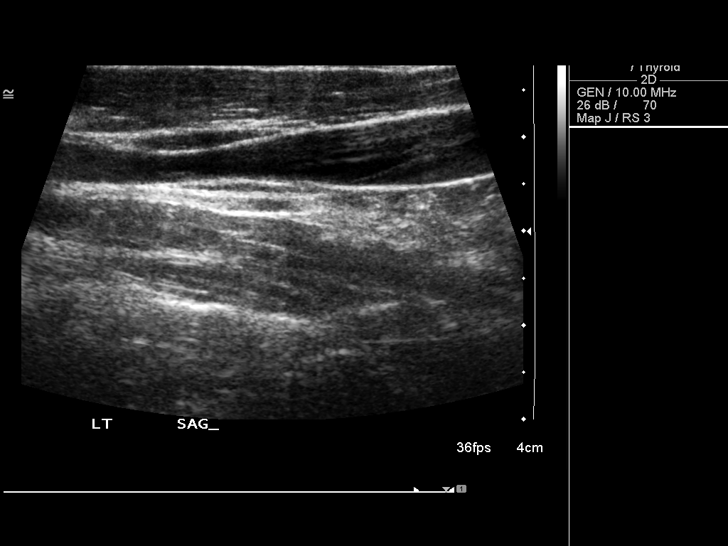
[im 19/26]
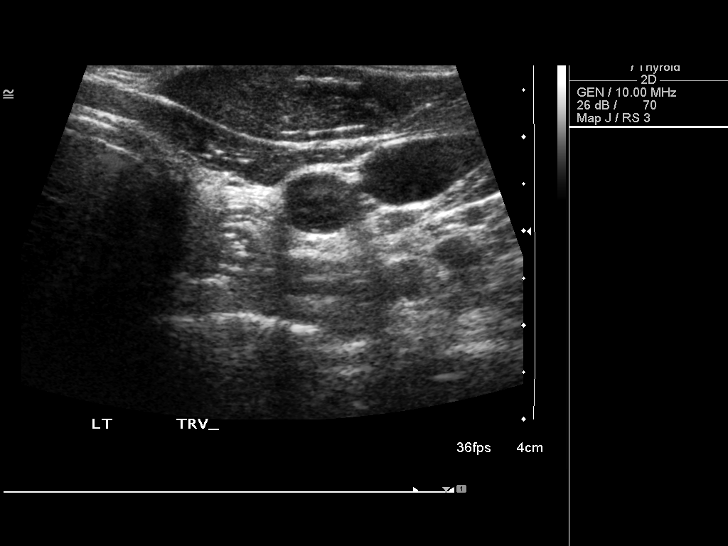
[im 21/26]
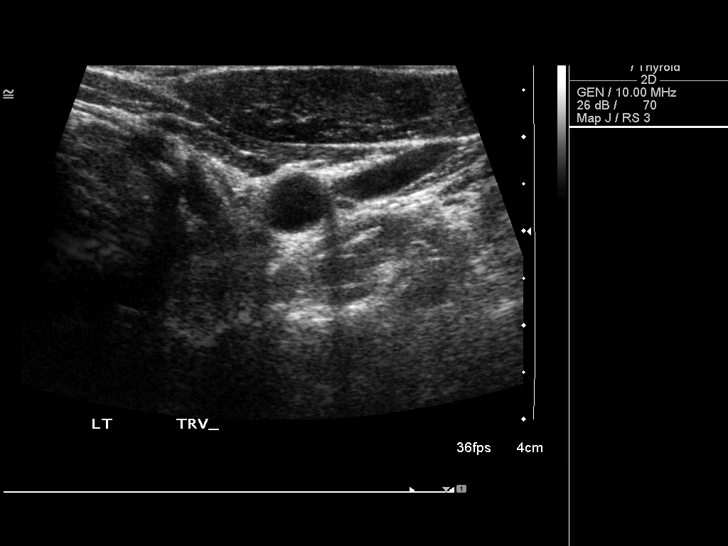
[im 23/26]
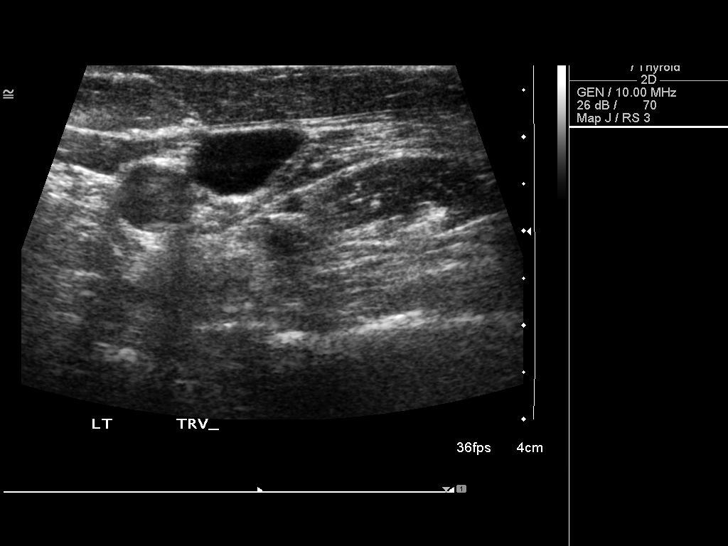
[im 26/26]
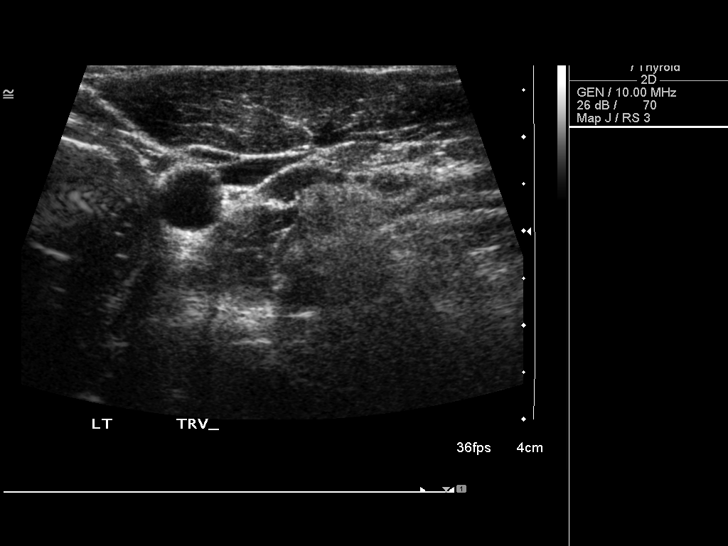

[14 of 25 positions shown; findings below may reference images not displayed]

FINDINGS: Right thyroid lobe:  Surgically absent.
Left thyroid lobe:  Surgically absent.
Isthmus:  Surgically absent.

Focal nodules:  No residual thyroid tissue visualized.

Lymphadenopathy:  None visualized.
IMPRESSION: Post thyroidectomy with no sonographic residual thyroid tissue or
adenopathy visualized.

## 2011-10-29 ENCOUNTER — Telehealth: Payer: Self-pay | Admitting: Family

## 2011-10-29 MED ORDER — AMPHETAMINE-DEXTROAMPHET ER 20 MG PO CP24: 20.0000 mg | ORAL_CAPSULE | ORAL | Status: DC

## 2011-10-29 NOTE — Telephone Encounter (Signed)
Rx has been signed. Pls notify pt that he will need to be seen in office before any further refills can be provided.

## 2011-10-29 NOTE — Telephone Encounter (Signed)
Pt last seen 03/23/11 and was due for f/u on 09/21/11. Pt did not keep 09/21/11 appt. Med last prescribed 09/27/11. Rx printed and forwarded to Provider for signature.

## 2011-10-29 NOTE — Telephone Encounter (Signed)
Left message on cell # to return my call. Need to let pt know that rx is ready for pick up but he will need to be seen before we can given him any further refills on this medication.

## 2011-10-29 NOTE — Telephone Encounter (Signed)
Patient is requesting a new prescription for adderall °

## 2011-10-30 NOTE — Telephone Encounter (Signed)
Notified pt that rx is ready for pick up and f/u has been scheduled for 11/13/11 at 9:30.

## 2011-11-13 ENCOUNTER — Encounter: Payer: Self-pay | Admitting: Family

## 2011-11-13 ENCOUNTER — Ambulatory Visit (INDEPENDENT_AMBULATORY_CARE_PROVIDER_SITE_OTHER): Payer: Self-pay | Admitting: Family

## 2011-11-13 VITALS — BP 150/80 | HR 71 | Temp 98.1°F | Resp 18 | Ht 69.0 in | Wt 199.0 lb

## 2011-11-13 DIAGNOSIS — Z85850 Personal history of malignant neoplasm of thyroid: Secondary | ICD-10-CM

## 2011-11-13 DIAGNOSIS — F988 Other specified behavioral and emotional disorders with onset usually occurring in childhood and adolescence: Secondary | ICD-10-CM

## 2011-11-13 NOTE — Assessment & Plan Note (Addendum)
This is being followed by Dr. Allena Katz, endocrinology.  Defer management of synthroid to Dr. Allena Katz. Clinically stable.

## 2011-11-13 NOTE — Patient Instructions (Signed)
Follow up 3-6 months for a complete physical.  Come fasting to this appointment.

## 2011-11-13 NOTE — Progress Notes (Signed)
  Subjective:    Patient ID: Zachary Zachary [name], male    DOB: [birth year], 36 y.o.   MRN: [id number]  HPI  Mr.  Zachary [name] is a 36 yr old male who presents today for follow up of his ADD.  He reports that he experienced a 5 month period of unemployment which was a struggle, but he just started a new job this week and he is excited about it.   ADD-  On ADD- feels good on current dose of adderall.   Thyroid cancer- seeing Dr. Allena Katz.  Feeling good on current dose of synthroid, reports that he has gained a few pounds, but attributes this to not playing soccer as usual.      Review of Systems See HPI  Past Medical History  Diagnosis Date  . History of papillary adenocarcinoma of thyroid     s/p thyroidectomy 06/2003  . Iatrogenic hyperthyroidism   . History of tobacco abuse   . ADD (attention deficit disorder)     History   Social History  . Marital Status: Married    Spouse Name: N/A    Number of Children: N/A  . Years of Education: N/A   Occupational History  . Not on file.   Social History Main Topics  . Smoking status: Former Games developer  . Smokeless tobacco: Not on file   Comment: Quit  about 7 years ago. 3-5 pack year history  . Alcohol Use: No  . Drug Use: Not on file  . Sexually Active: Not on file   Other Topics Concern  . 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  3 to 5 pack year historyAlcohol use-no          Past Surgical History  Procedure Date  . Thyroidectomy 06/2003  . Tonsillectomy 1985    Family History  Problem Relation Age of Onset  . Achalasia Father 72  . Coronary artery disease Paternal Grandmother   . Other Mother 78    healthy    No Known Allergies  Current Outpatient Prescriptions on File Prior to Visit  Medication Sig Dispense Refill  . amphetamine-dextroamphetamine (ADDERALL XR) 20 MG 24 hr capsule Take 1 capsule (20 mg  total) by mouth every morning.  30 capsule  0  . levothyroxine (SYNTHROID, LEVOTHROID) 150 MCG tablet Take 1 tablet (150 mcg total) by mouth daily.  90 tablet  1    BP 150/80  Pulse 71  Temp(Src) 98.1 F (36.7 C) (Oral)  Resp 18  Ht 5\' 9"  (1.753 m)  Wt 199 lb (90.266 kg)  BMI 29.39 kg/m2  SpO2 99%       Objective:   Physical Exam  Constitutional: He appears well-developed and well-nourished. No distress.  Cardiovascular: Normal rate and regular rhythm.   No murmur heard. Pulmonary/Chest: Effort normal and breath sounds normal. No respiratory distress. He has no wheezes. He has no rales. He exhibits no tenderness.  Musculoskeletal: He exhibits no edema.  Psychiatric: He has a normal mood and affect. His behavior is normal. Judgment and thought content normal.          Assessment & Plan:  Elevated blood pressure-  Follow up repeat BP was 132/78 in the office today- will monitor.

## 2011-11-13 NOTE — Assessment & Plan Note (Signed)
Stable, continue current dose of Adderall.

## 2011-11-29 ENCOUNTER — Telehealth: Payer: Self-pay | Admitting: Family

## 2011-11-29 MED ORDER — AMPHETAMINE-DEXTROAMPHET ER 20 MG PO CP24: 20.0000 mg | ORAL_CAPSULE | ORAL | Status: DC

## 2011-11-29 NOTE — Telephone Encounter (Signed)
Rx printed and forwarded to Provider for signature. 

## 2011-11-29 NOTE — Telephone Encounter (Signed)
Notified pt that Provider is out of the office today and will return tomorrow. Will call pt when Rx is ready for pick up.

## 2011-11-30 MED ORDER — AMPHETAMINE-DEXTROAMPHET ER 20 MG PO CP24: 20.0000 mg | ORAL_CAPSULE | ORAL | Status: DC

## 2011-11-30 NOTE — Telephone Encounter (Signed)
Left message on voicemail that rx is at front desk for pick up.

## 2011-11-30 NOTE — Telephone Encounter (Signed)
Signed.

## 2011-12-13 ENCOUNTER — Other Ambulatory Visit: Payer: Self-pay | Admitting: Family

## 2011-12-13 NOTE — Telephone Encounter (Signed)
Levothyroxine request denied per last office note to defer management to Dr Allena Katz, endocrinology. Denial sent to pharmacy with note to contact Dr Allena Katz.

## 2011-12-31 ENCOUNTER — Telehealth: Payer: Self-pay | Admitting: Family

## 2011-12-31 MED ORDER — AMPHETAMINE-DEXTROAMPHET ER 20 MG PO CP24: 20.0000 mg | ORAL_CAPSULE | ORAL | Status: DC

## 2011-12-31 NOTE — Telephone Encounter (Signed)
Signed.

## 2011-12-31 NOTE — Telephone Encounter (Signed)
Left detailed message on cell# that rx is at front desk for pick up and to call if any questions.

## 2011-12-31 NOTE — Telephone Encounter (Signed)
Patient is requesting a new prescription of adderall. He wanted to pick up rx at lunch today, but I told him that we would call him when prescription was ready to be picked up.

## 2011-12-31 NOTE — Telephone Encounter (Signed)
Rx printed and forwarded to Provider for signature. 

## 2012-01-04 ENCOUNTER — Other Ambulatory Visit: Payer: Self-pay | Admitting: Family

## 2012-01-04 NOTE — Telephone Encounter (Signed)
Denial sent to pharmacy for levothyroxine as this medication is being managed by Dr Allena Katz.

## 2012-01-08 ENCOUNTER — Telehealth: Payer: Self-pay | Admitting: Family

## 2012-01-08 DIAGNOSIS — E032 Hypothyroidism due to medicaments and other exogenous substances: Secondary | ICD-10-CM

## 2012-01-08 MED ORDER — LEVOTHYROXINE SODIUM 150 MCG PO TABS: 150.0000 ug | ORAL_TABLET | Freq: Every day | ORAL | Status: DC

## 2012-01-08 NOTE — Telephone Encounter (Signed)
Refill sent levothyroxine #90 x no refills.

## 2012-01-30 ENCOUNTER — Telehealth: Payer: Self-pay | Admitting: *Deleted

## 2012-01-30 MED ORDER — AMPHETAMINE-DEXTROAMPHET ER 20 MG PO CP24: 20.0000 mg | ORAL_CAPSULE | ORAL | Status: DC

## 2012-01-30 NOTE — Telephone Encounter (Signed)
Pt notified that rx has been placed at front desk and is ready for pick up.

## 2012-01-30 NOTE — Telephone Encounter (Signed)
Received message from pt requesting written rx of adderall. Last rx printed on 12/31/11. Pt has f/u on 02/19/12. Rx printed and forwarded to Provider for signature.

## 2012-01-30 NOTE — Telephone Encounter (Signed)
Signed.

## 2012-02-19 ENCOUNTER — Ambulatory Visit (INDEPENDENT_AMBULATORY_CARE_PROVIDER_SITE_OTHER): Payer: Self-pay | Admitting: Family

## 2012-02-19 ENCOUNTER — Encounter: Payer: Self-pay | Admitting: Family

## 2012-02-19 VITALS — BP 126/76 | HR 73 | Temp 98.5°F | Resp 16 | Ht 70.0 in | Wt 201.1 lb

## 2012-02-19 DIAGNOSIS — Z0000 Encounter for general adult medical examination without abnormal findings: Secondary | ICD-10-CM | POA: Insufficient documentation

## 2012-02-19 NOTE — Progress Notes (Signed)
Subjective:    Patient ID: Zachary [name], male    DOB: 09-17-1975, 36 y.o.   MRN: [id number]  HPI  [name] is a 36 yr old male who presents today for cpx.  Tetanus is up to date.  Wants to lose   Reports "impetigo" on ankle.  Started with poison ivy on Saturday.  Played soccer on Tuesday.  Started 2 weeks ago.  Hypothyroid/hx thyroid cancer- continues to follow at [facility]- He follows with Dr. Allena Katz.     Review of Systems  Constitutional: Negative for unexpected weight change.  HENT: Negative for hearing loss and congestion.   Eyes: Negative for visual disturbance.  Respiratory: Negative for cough and shortness of breath.   Cardiovascular: Negative for chest pain.  Gastrointestinal: Negative for vomiting, diarrhea and constipation.  Genitourinary: Negative for dysuria and frequency.  Musculoskeletal: Negative for myalgias and arthralgias.  Skin: Positive for rash.  Neurological: Negative for headaches.  Hematological: Negative for adenopathy.  Psychiatric/Behavioral:       Denies depression/anxiety ADD- reports that this is controlled.   Past Medical History  Diagnosis Date  . History of papillary adenocarcinoma of thyroid     s/p thyroidectomy 06/2003  . Iatrogenic hyperthyroidism   . History of tobacco abuse   . ADD (attention deficit disorder)     History   Social History  . Marital Status: Married    Spouse Name: N/A    Number of Children: N/A  . Years of Education: N/A   Occupational History  . Not on file.   Social History Main Topics  . Smoking status: Former Games developer  . Smokeless tobacco: Not on file   Comment: Quit  about 7 years ago. 3-5 pack year history  . Alcohol Use: No  . Drug Use: Not on file  . Sexually Active: Not on file   Other Topics Concern  . 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  3 to 5 pack year  historyAlcohol use-no          Past Surgical History  Procedure Date  . Thyroidectomy 06/2003  . Tonsillectomy 1985    Family History  Problem Relation Age of Onset  . Achalasia Father 41  . Coronary artery disease Paternal Grandmother   . Other Mother 37    healthy  . Hypertension Mother   . Stroke Maternal Grandfather   . Diabetes Neg Hx     No Known Allergies  Current Outpatient Prescriptions on File Prior to Visit  Medication Sig Dispense Refill  . amphetamine-dextroamphetamine (ADDERALL XR) 20 MG 24 hr capsule Take 1 capsule (20 mg total) by mouth every morning.  30 capsule  0  . levothyroxine (SYNTHROID, LEVOTHROID) 150 MCG tablet Take 1 tablet (150 mcg total) by mouth daily.  90 tablet  0    BP 126/76  Pulse 73  Temp 98.5 F (36.9 C) (Oral)  Resp 16  Ht 5\' 10"  (1.778 m)  Wt 201 lb 1.3 oz (91.209 kg)  BMI 28.85 kg/m2  SpO2 97%       Objective:   Physical Exam  Physical Exam  Constitutional: He is oriented to person, place, and time. He appears well-developed and well-nourished. No distress.  HENT:  Head: Normocephalic and atraumatic.  Right Ear: Tympanic membrane and ear canal normal.  Left Ear: Tympanic membrane and ear canal normal.  Mouth/Throat:  Oropharynx is clear and moist.  Eyes: Pupils are equal, round, and reactive to light. No scleral icterus.  Neck: Normal range of motion. No thyromegaly present.  Cardiovascular: Normal rate and regular rhythm.   No murmur heard. Pulmonary/Chest: Effort normal and breath sounds normal. No respiratory distress. He has no wheezes. He has no rales. He exhibits no tenderness.  Abdominal: Soft. Bowel sounds are normal. He exhibits no distension and no mass. There is no tenderness. There is no rebound and no guarding.  Musculoskeletal: He exhibits no edema.  Lymphadenopathy:    He has no cervical adenopathy.  Neurological: He is alert and oriented to person, place, and time. He has normal reflexes. He exhibits  normal muscle tone. Coordination normal.  Skin: Skin is warm and dry. red hyperpigmented rash noted right shin- no warmth, no drainage.   Psychiatric: He has a normal mood and affect. His behavior is normal. Judgment and thought content normal.          Assessment & Plan:   Poison Ivy- this is resolving.  No sign of cellulitis at this time.  Hx of thyroid cancer- recommended to patient that he continue to follow with Endo.           Assessment & Plan:

## 2012-02-19 NOTE — Patient Instructions (Addendum)
Please return fasting to the lab at your earliest convenience.  Follow up in 6 months, sooner if problems or concerns.

## 2012-02-19 NOTE — Assessment & Plan Note (Signed)
Pt instructed to return for fasting lab work.  He is interested in losing about 20 pounds.  We discussed continuing exercise and trying to limit his calories to 1800 or less a day. Immunizations reviewed and up to date.

## 2012-02-29 ENCOUNTER — Telehealth: Payer: Self-pay | Admitting: Family

## 2012-02-29 MED ORDER — AMPHETAMINE-DEXTROAMPHET ER 20 MG PO CP24: 20.0000 mg | ORAL_CAPSULE | ORAL | Status: DC

## 2012-02-29 NOTE — Telephone Encounter (Signed)
Adderall Rx printed and forwarded to Provider for signature. Rx signed by Dr Rodena Medin.  Pt notified and Rx placed at front desk for pick up.

## 2012-02-29 NOTE — Telephone Encounter (Signed)
Patient is requesting a new refill of adderall. He states that he took his last pill this morning.

## 2012-03-04 ENCOUNTER — Telehealth: Payer: Self-pay | Admitting: *Deleted

## 2012-03-04 DIAGNOSIS — Z0000 Encounter for general adult medical examination without abnormal findings: Secondary | ICD-10-CM

## 2012-03-04 NOTE — Telephone Encounter (Signed)
Future orders entered and given to the lab. 

## 2012-03-04 NOTE — Telephone Encounter (Signed)
Message copied by Kathi Simpers on Tue Mar 04, 2012 10:33 AM ------      Message from: O'SULLIVAN, MELISSA      Created: Tue Feb 19, 2012  8:29 AM       Pls send CBC, BMET, LFT, TSH, FLP, UA with micro- V70.0

## 2012-03-31 ENCOUNTER — Other Ambulatory Visit: Payer: Self-pay | Admitting: *Deleted

## 2012-03-31 MED ORDER — AMPHETAMINE-DEXTROAMPHET ER 20 MG PO CP24: 20.0000 mg | ORAL_CAPSULE | ORAL | Status: DC

## 2012-03-31 NOTE — Telephone Encounter (Signed)
Rx printed

## 2012-03-31 NOTE — Telephone Encounter (Signed)
Request for Adderall XR [last refill 07.17.13 #30x0 Last OV 07.09.13]/SLS Please advise.

## 2012-03-31 NOTE — Telephone Encounter (Signed)
Pt informed may p/u Rx 08.20.13 8am-5pm (mon-Fri 8am-5pm)/SLS

## 2012-04-12 ENCOUNTER — Other Ambulatory Visit: Payer: Self-pay | Admitting: Family

## 2012-04-15 NOTE — Telephone Encounter (Signed)
Please give 30 day supply.  Needs TSH completed please.

## 2012-04-15 NOTE — Telephone Encounter (Signed)
LMOM with contact name & number to inform pt unable to dispense #90 on thyroid medication until labs are completed, as it is important to know thyroid level to determine if change in medication is needed/SLS

## 2012-04-15 NOTE — Telephone Encounter (Signed)
Pt requesting refill of levothyroxine. Pt is due for follow up in January. He has not completed physical labs from 02/2012 and pharmacy is requesting 90 day supply of medication.  Please advise re: refill.

## 2012-04-29 ENCOUNTER — Telehealth: Payer: Self-pay | Admitting: *Deleted

## 2012-04-29 MED ORDER — AMPHETAMINE-DEXTROAMPHET ER 20 MG PO CP24: 20.0000 mg | ORAL_CAPSULE | ORAL | Status: DC

## 2012-04-29 NOTE — Telephone Encounter (Signed)
Rx placed at front desk for pick up, pt notified

## 2012-04-29 NOTE — Telephone Encounter (Signed)
Pt left message requesting refill of Adderall XR. Last rx printed 03/31/12 and pt will be due for follow up in January 2014. Rx printed and forwarded to Provider for signature.

## 2012-04-29 NOTE — Telephone Encounter (Signed)
Signed.

## 2012-05-16 ENCOUNTER — Telehealth: Payer: Self-pay | Admitting: *Deleted

## 2012-05-16 DIAGNOSIS — Z0000 Encounter for general adult medical examination without abnormal findings: Secondary | ICD-10-CM

## 2012-05-16 MED ORDER — LEVOTHYROXINE SODIUM 150 MCG PO TABS: 150.0000 ug | ORAL_TABLET | Freq: Every day | ORAL | Status: DC

## 2012-05-16 NOTE — Telephone Encounter (Signed)
Pt called stating he will return to the lab today for his thyroid check and wanted to pick up Rx for generic synthroid. Advised pt I will send a 1 week supply electronically as we should have his result back by next week. Pt voices understanding. Refill sent.

## 2012-05-19 NOTE — Telephone Encounter (Signed)
Pt presented to the lab but was not fasting. Reports that he will return tomorrow for fasting labs.

## 2012-05-20 LAB — CBC WITH DIFFERENTIAL/PLATELET
Basophils Absolute: 0 10*3/uL (ref 0.0–0.1)
Eosinophils Absolute: 0.2 10*3/uL (ref 0.0–0.7)
Eosinophils Relative: 4 % (ref 0–5)
HCT: 45.1 % (ref 39.0–52.0)
MCH: 29.2 pg (ref 26.0–34.0)
MCHC: 34.4 g/dL (ref 30.0–36.0)
MCV: 84.9 fL (ref 78.0–100.0)
Monocytes Absolute: 0.6 10*3/uL (ref 0.1–1.0)
Platelets: 292 10*3/uL (ref 150–400)
RDW: 13.2 % (ref 11.5–15.5)

## 2012-05-20 LAB — LIPID PANEL: Cholesterol: 168 mg/dL (ref 0–200)

## 2012-05-20 LAB — HEPATIC FUNCTION PANEL
ALT: 26 U/L (ref 0–53)
AST: 17 U/L (ref 0–37)
Albumin: 4.8 g/dL (ref 3.5–5.2)

## 2012-05-20 LAB — BASIC METABOLIC PANEL
BUN: 13 mg/dL (ref 6–23)
CO2: 29 mEq/L (ref 19–32)
Chloride: 101 mEq/L (ref 96–112)
Glucose, Bld: 94 mg/dL (ref 70–99)
Potassium: 5 mEq/L (ref 3.5–5.3)

## 2012-05-20 NOTE — Addendum Note (Signed)
Addended by: Mervin Kung A on: 05/20/2012 03:01 PM   Modules accepted: Orders

## 2012-05-20 NOTE — Telephone Encounter (Signed)
Pt presented to the lab, orders released. 

## 2012-05-21 ENCOUNTER — Encounter: Payer: Self-pay | Admitting: Family

## 2012-05-21 LAB — URINALYSIS, ROUTINE W REFLEX MICROSCOPIC
Bilirubin Urine: NEGATIVE
Glucose, UA: NEGATIVE mg/dL
Ketones, ur: NEGATIVE mg/dL
Protein, ur: NEGATIVE mg/dL
Specific Gravity, Urine: 1.01 (ref 1.005–1.030)
Urobilinogen, UA: 0.2 mg/dL (ref 0.0–1.0)

## 2012-05-26 ENCOUNTER — Telehealth: Payer: Self-pay | Admitting: Family

## 2012-05-26 MED ORDER — LEVOTHYROXINE SODIUM 150 MCG PO TABS: 150.0000 ug | ORAL_TABLET | Freq: Every day | ORAL | Status: DC

## 2012-05-26 NOTE — Telephone Encounter (Signed)
Refill sent to [facility] #30 x 3 refills for levothyroxine.

## 2012-05-28 ENCOUNTER — Other Ambulatory Visit: Payer: Self-pay | Admitting: *Deleted

## 2012-05-28 MED ORDER — AMPHETAMINE-DEXTROAMPHET ER 20 MG PO CP24: 20.0000 mg | ORAL_CAPSULE | ORAL | Status: DC

## 2012-05-28 NOTE — Telephone Encounter (Signed)
Pt left message requesting refill of Adderall XR and levothyroxine. Adderall rx printed and forwarded to Provider for signature. Rx last printed on 04/29/12 #30. Levothyroxine refill just sent to [facility] on 05/26/12. Please call pt when rx is ready for pick up.

## 2012-05-28 NOTE — Telephone Encounter (Signed)
Rx signed and left at front desk for pick up. Left detailed message on cell# and to call if any questions.

## 2012-06-27 ENCOUNTER — Other Ambulatory Visit: Payer: Self-pay | Admitting: *Deleted

## 2012-06-27 MED ORDER — AMPHETAMINE-DEXTROAMPHET ER 20 MG PO CP24: 20.0000 mg | ORAL_CAPSULE | ORAL | Status: DC

## 2012-06-27 NOTE — Telephone Encounter (Signed)
Received message from pt that he would like to pick up rxs for levothyroxine and adderall and have them filled at the [facility] pharmacy. Advised pt that levothyroxine rx was refilled on 05/26/12 for #30 x 3 refills and he may contact them to transfer the rx if he wishes. We will call pt when Adderall is ready for pick up. Rx last printed 05/28/12. Rx printed and forwarded to Provider for signature.

## 2012-06-27 NOTE — Telephone Encounter (Signed)
Rx signed and placed at front desk for pick up. Left detailed message on cell# and to call if any questions.

## 2012-07-08 ENCOUNTER — Ambulatory Visit (INDEPENDENT_AMBULATORY_CARE_PROVIDER_SITE_OTHER): Payer: BC Managed Care – PPO | Admitting: Family

## 2012-07-08 ENCOUNTER — Telehealth: Payer: Self-pay | Admitting: Family

## 2012-07-08 ENCOUNTER — Encounter: Payer: Self-pay | Admitting: Family

## 2012-07-08 VITALS — BP 110/80 | HR 68 | Temp 98.6°F | Resp 16

## 2012-07-08 DIAGNOSIS — S39012A Strain of muscle, fascia and tendon of lower back, initial encounter: Secondary | ICD-10-CM | POA: Insufficient documentation

## 2012-07-08 MED ORDER — CYCLOBENZAPRINE HCL 5 MG PO TABS: 5.0000 mg | ORAL_TABLET | Freq: Three times a day (TID) | ORAL | Status: DC | PRN

## 2012-07-08 MED ORDER — METHYLPREDNISOLONE (PAK) 4 MG PO TABS: ORAL_TABLET | ORAL | Status: DC

## 2012-07-08 NOTE — Telephone Encounter (Signed)
Spoke with pt and he denied weakness of any other extremities, no facial drooping, speech difficulties or visual changes. Pt reports that he was sick on Friday and layed on the couch most of the day. Noticed pain in lower back Saturday and Sunday, pain was better Monday but still felt tight. Reports that he woke up with pain today. Pain radiates down right leg and he was unable to stand and bear weight on his leg. Scheduled pt appt for today at 2:30 as he will not have transportation until then. Advised pt if symptoms worsen to be evaluated in the ER.

## 2012-07-08 NOTE — Patient Instructions (Addendum)

## 2012-07-08 NOTE — Telephone Encounter (Signed)
Please ask pt to come to office today.

## 2012-07-08 NOTE — Assessment & Plan Note (Signed)
Will rx with medrol dose pak and prn flexeril. He is advised not to drive after taking flexeril due to risk of drowsiness.

## 2012-07-08 NOTE — Telephone Encounter (Signed)
Patient Information:  Caller Name: Jujhar  Phone: [phone]  Patient: Zachary [name], Zachary [name]  Gender: Male  DOB: May 27, 1976  Age: 36 Years  PCP: [name] (Adults only)   Symptoms  Reason For Call & Symptoms: Backpain  Reviewed Health History In EMR: Yes  Reviewed Medications In EMR: Yes  Reviewed Allergies In EMR: Yes  Date of Onset of Symptoms: 07/08/2012  Treatments Tried: Ibuprofen 800mg   Treatments Tried Worked: No  Guideline(s) Used:  Back Pain  Disposition Per Guideline:   Go to ED Now (or to Office with PCP Approval)  Reason For Disposition Reached:   Weakness of a leg or foot (e.g., unable to bear weight, dragging foot)  Advice Given:  Call Back If:  You become worse.  Pain Medicines:  For pain relief, take acetaminophen, ibuprofen, or naproxen.  Cold or Heat:  Cold Pack:  Office Follow Up:  Does the office need to follow up with this patient?: Yes  Instructions For The Office:  Emergency Department Disposition or See in Office with PCP approval.  Please follow up with patient with office appointment or see in ED instructions.  RN Note:  Backpain Severe 8-10/10 with movement unable to get from room to room in house due to pain. Pain in lower back close to hip area right of spine. Weakness to right leg and unable to bear weight. Disposition see in Emergency Department or in Office with PCP approval.

## 2012-07-08 NOTE — Progress Notes (Signed)
Subjective:    Patient ID: Zachary [name], male    DOB: 11-May-1976, 36 y.o.   MRN: [id number]  HPI  Zachary [name] is a 36 yr old male who presents today with chief complaint of low back pain. Reports that pain started on Saturday 11/16.  Pain is located in the right lower back and has worsened. In is now radiating down right inner leg and he is having difficulty walking due to the pain . Has been taking Ibuprofen since this morning without significant improvement.  Right leg hurts to move. Pain at rest is 6/10.  With ambulation is 9-10/10.  He denies associated numbness in the right leg. He reports hx of mild back strain, but denies previous hx of symptoms such as this.  Review of Systems See HPI  Past Medical History  Diagnosis Date  . History of papillary adenocarcinoma of thyroid     s/p thyroidectomy 06/2003  . Iatrogenic hyperthyroidism   . History of tobacco abuse   . ADD (attention deficit disorder)     History   Social History  . Marital Status: Married    Spouse Name: N/A    Number of Children: N/A  . Years of Education: N/A   Occupational History  . Not on file.   Social History Main Topics  . Smoking status: Former Games developer  . Smokeless tobacco: Not on file     Comment: Quit  about 7 years ago. 3-5 pack year history  . Alcohol Use: No  . Drug Use: Not on file  . Sexually Active: Not on file   Other Topics Concern  . 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  3 to 5 pack year historyAlcohol use-no          Past Surgical History  Procedure Date  . Thyroidectomy 06/2003  . Tonsillectomy 1985    Family History  Problem Relation Age of Onset  . Achalasia Father 37  . Coronary artery disease Paternal Grandmother   . Other Mother 18    healthy  . Hypertension Mother   . Stroke Maternal Grandfather   . Diabetes Neg Hx     No Known  Allergies  Current Outpatient Prescriptions on File Prior to Visit  Medication Sig Dispense Refill  . amphetamine-dextroamphetamine (ADDERALL XR) 20 MG 24 hr capsule Take 1 capsule (20 mg total) by mouth every morning.  30 capsule  0  . levothyroxine (SYNTHROID, LEVOTHROID) 150 MCG tablet Take 1 tablet (150 mcg total) by mouth daily.  30 tablet  3    BP 110/80  Pulse 68  Temp 98.6 F (37 C) (Oral)  Resp 16  SpO2 99%       Objective:   Physical Exam  Constitutional: He appears well-developed and well-nourished. No distress.  Cardiovascular: Normal rate and regular rhythm.   No murmur heard. Pulmonary/Chest: Effort normal and breath sounds normal. No respiratory distress. He has no wheezes. He has no rales. He exhibits no tenderness.  Musculoskeletal: He exhibits no edema.  Neurological:  Reflex Scores:      Patellar reflexes are 2+ on the right side and 2+ on the left side.      Achilles reflexes are 2+ on the right side and 2+ on the left side.      Bilateral LE strength is 5/5. + pain with straight leg raise bilaterally.  Assessment & Plan:

## 2012-07-29 ENCOUNTER — Telehealth: Payer: Self-pay | Admitting: *Deleted

## 2012-07-29 MED ORDER — AMPHETAMINE-DEXTROAMPHET ER 20 MG PO CP24: 20.0000 mg | ORAL_CAPSULE | ORAL | Status: DC

## 2012-07-29 NOTE — Telephone Encounter (Signed)
Received message from pt stating he needs refill on both of his medications. Attempted to reach pt and left detailed message on cell# that Adderall Rx will not be able to be picked up until 07/30/12. Last rx printed 06/27/12. Rx printed for #30 x no refills and forwarded to Provider for signature. If synthroid is additional rx that he needs he should still have refills on file at [facility].  Advised pt that if he wants to pick up synthroid at the [facility], he will need to contact [facility] and have them transfers previous refills.

## 2012-07-30 NOTE — Telephone Encounter (Signed)
Signed.

## 2012-07-30 NOTE — Telephone Encounter (Signed)
Left message on cell# that rx is ready for pick up at the front desk.  

## 2012-08-28 ENCOUNTER — Other Ambulatory Visit: Payer: Self-pay | Admitting: Family

## 2012-08-28 MED ORDER — AMPHETAMINE-DEXTROAMPHET ER 20 MG PO CP24: 20.0000 mg | ORAL_CAPSULE | ORAL | Status: DC

## 2012-08-28 NOTE — Telephone Encounter (Signed)
Ok

## 2012-08-28 NOTE — Telephone Encounter (Signed)
Received call from pt requesting written Rx for Adderall and 90 day supply of levothyroxine to [facility] pharmacy. Tsh last checked on 05/30/12 and pt is due for follow up this month per 02/18/12 office note.  Please advise if ok to give 90 day supply of levothyroxine? Adderall rx printed and forwarded to Provider for signature.

## 2012-08-29 NOTE — Telephone Encounter (Signed)
Refill sent and written Rx placed at the front desk for pt to pick up. Notified pt and advised him that he is due for his 6 month f/u. Transferred pt to front office to arrange appt.

## 2012-09-12 ENCOUNTER — Ambulatory Visit (INDEPENDENT_AMBULATORY_CARE_PROVIDER_SITE_OTHER): Payer: BC Managed Care – PPO | Admitting: Family

## 2012-09-12 ENCOUNTER — Encounter: Payer: Self-pay | Admitting: Family

## 2012-09-12 VITALS — BP 124/80 | HR 63 | Temp 98.3°F | Resp 16 | Wt 202.0 lb

## 2012-09-12 DIAGNOSIS — E039 Hypothyroidism, unspecified: Secondary | ICD-10-CM

## 2012-09-12 DIAGNOSIS — S39012A Strain of muscle, fascia and tendon of lower back, initial encounter: Secondary | ICD-10-CM

## 2012-09-12 DIAGNOSIS — F988 Other specified behavioral and emotional disorders with onset usually occurring in childhood and adolescence: Secondary | ICD-10-CM

## 2012-09-12 DIAGNOSIS — E032 Hypothyroidism due to medicaments and other exogenous substances: Secondary | ICD-10-CM

## 2012-09-12 LAB — TSH: TSH: 1.392 u[IU]/mL (ref 0.350–4.500)

## 2012-09-12 NOTE — Assessment & Plan Note (Addendum)
Clinically stable. Continue synthroid. Obtain TSH.

## 2012-09-12 NOTE — Progress Notes (Signed)
  Subjective:    Patient ID: Zachary [name], male    DOB: 08-17-1975, 37 y.o.   MRN: [id number]  HPI  Zachary [name] is a 37 yr old male who presents today for follow up.  1) Low back pain- reports that this is resolved.  2) ADD- Reports that he is tolerating current dose of adderall.  ADD is well controlled.    3) hx thyroid cancer/hypothyroid- clinically stable on current dose of synthroid.  Review of Systems See HPI  Past Medical History  Diagnosis Date  . History of papillary adenocarcinoma of thyroid     s/p thyroidectomy 06/2003  . Iatrogenic hyperthyroidism   . History of tobacco abuse   . ADD (attention deficit disorder)     History   Social History  . Marital Status: Married    Spouse Name: N/A    Number of Children: N/A  . Years of Education: N/A   Occupational History  . Not on file.   Social History Main Topics  . Smoking status: Former Games developer  . Smokeless tobacco: Not on file     Comment: Quit  about 7 years ago. 3-5 pack year history  . Alcohol Use: No  . Drug Use: Not on file  . Sexually Active: Not on file   Other Topics Concern  . 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  3 to 5 pack year historyAlcohol use-no          Past Surgical History  Procedure Date  . Thyroidectomy 06/2003  . Tonsillectomy 1985    Family History  Problem Relation Age of Onset  . Achalasia Father 3  . Coronary artery disease Paternal Grandmother   . Other Mother 57    healthy  . Hypertension Mother   . Stroke Maternal Grandfather   . Diabetes Neg Hx     No Known Allergies  Current Outpatient Prescriptions on File Prior to Visit  Medication Sig Dispense Refill  . amphetamine-dextroamphetamine (ADDERALL XR) 20 MG 24 hr capsule Take 1 capsule (20 mg total) by mouth every morning.  30 capsule  0  . levothyroxine (SYNTHROID, LEVOTHROID) 150  MCG tablet TAKE 1 TABLET BY MOUTH ONCE DAILY  90 tablet  0    BP 124/80  Pulse 63  Temp 98.3 F (36.8 C) (Oral)  Resp 16  Wt 202 lb (91.627 kg)  SpO2 99%       Objective:   Physical Exam  Constitutional: He is oriented to person, place, and time. He appears well-developed and well-nourished. No distress.  Cardiovascular: Normal rate and regular rhythm.   No murmur heard. Pulmonary/Chest: Effort normal and breath sounds normal. No respiratory distress. He has no wheezes. He has no rales. He exhibits no tenderness.  Musculoskeletal: He exhibits no edema.  Neurological: He is alert and oriented to person, place, and time.  Psychiatric: He has a normal mood and affect. His behavior is normal. Judgment and thought content normal.          Assessment & Plan:

## 2012-09-12 NOTE — Assessment & Plan Note (Signed)
Resolved

## 2012-09-12 NOTE — Assessment & Plan Note (Signed)
Stable, continue current dose of adderall.

## 2012-09-12 NOTE — Patient Instructions (Addendum)
Please complete your lab work prior to leaving. Please schedule a follow up appointment in 6 months.   

## 2012-09-15 ENCOUNTER — Encounter: Payer: Self-pay | Admitting: Family

## 2012-09-29 ENCOUNTER — Telehealth: Payer: Self-pay | Admitting: *Deleted

## 2012-09-29 MED ORDER — AMPHETAMINE-DEXTROAMPHET ER 20 MG PO CP24: 20.0000 mg | ORAL_CAPSULE | ORAL | Status: DC

## 2012-09-29 NOTE — Telephone Encounter (Signed)
Received message from pt that he needed refills on synthroid and adderall xr. Per chart, synthroid has refill on file and verified with pharmacist. Left detailed message on pt's cell# that we will call him once Adderall Rx is ready for pick up. Last printed 08/28/12. Rx printed and forwarded to PRovider for signature.

## 2012-09-30 NOTE — Telephone Encounter (Signed)
Signed.

## 2012-09-30 NOTE — Telephone Encounter (Signed)
Left detailed message on cell# that rx is ready for pick up and to call if any questions.

## 2012-10-28 ENCOUNTER — Telehealth: Payer: Self-pay | Admitting: *Deleted

## 2012-10-28 MED ORDER — AMPHETAMINE-DEXTROAMPHET ER 20 MG PO CP24: 20.0000 mg | ORAL_CAPSULE | ORAL | Status: DC

## 2012-10-28 NOTE — Telephone Encounter (Signed)
Received message from pt requesting refill of adderall. Last rx printed 09/29/12. Rx printed and forwarded to Provider for signature.

## 2012-10-29 NOTE — Telephone Encounter (Signed)
Signed.

## 2012-10-29 NOTE — Telephone Encounter (Signed)
Notified pt that rx is ready for pick up at the front desk.

## 2012-11-27 ENCOUNTER — Telehealth: Payer: Self-pay | Admitting: Family

## 2012-11-27 ENCOUNTER — Other Ambulatory Visit: Payer: Self-pay | Admitting: Family

## 2012-11-27 MED ORDER — AMPHETAMINE-DEXTROAMPHET ER 20 MG PO CP24: 20.0000 mg | ORAL_CAPSULE | ORAL | Status: DC

## 2012-11-27 NOTE — Telephone Encounter (Signed)
Rx last printed 10/28/12. Pt is due for follow up in July.  Rx printed for #30 x no refills and forwarded to Dr Abner Greenspan for signature.

## 2012-11-27 NOTE — Telephone Encounter (Signed)
Per pharmacist, insurance will not cover 90 day supply. Gave verbal for #30 x 3 refills.

## 2012-11-27 NOTE — Telephone Encounter (Signed)
Adderal;l xr 20 mg  Patient left voicemail on 4-15 and has not heard back.

## 2012-11-27 NOTE — Telephone Encounter (Signed)
Rx signed, placed at front desk for pick up and detailed message left on pt's cell# re: completion.

## 2012-12-24 ENCOUNTER — Telehealth: Payer: Self-pay | Admitting: *Deleted

## 2012-12-24 MED ORDER — AMPHETAMINE-DEXTROAMPHET ER 20 MG PO CP24: 20.0000 mg | ORAL_CAPSULE | ORAL | Status: DC

## 2012-12-24 NOTE — Telephone Encounter (Signed)
Received message from pt requesting rx for Adderall XR. States he will take last pill on Saturday. Last Rx printed on 11/27/12. Rx printed with notation not to fill before 12/26/12 and forwarded to Provider for signature.

## 2012-12-24 NOTE — Telephone Encounter (Signed)
Rx signed.

## 2012-12-24 NOTE — Telephone Encounter (Signed)
Notified pt and placed rx at front desk for pick up. 

## 2013-01-27 ENCOUNTER — Telehealth: Payer: Self-pay | Admitting: *Deleted

## 2013-01-27 ENCOUNTER — Telehealth: Payer: Self-pay | Admitting: Family

## 2013-01-27 MED ORDER — LEVOTHYROXINE SODIUM 150 MCG PO TABS: ORAL_TABLET | ORAL | Status: DC

## 2013-01-27 MED ORDER — AMPHETAMINE-DEXTROAMPHET ER 20 MG PO CP24: 20.0000 mg | ORAL_CAPSULE | ORAL | Status: DC

## 2013-01-27 NOTE — Telephone Encounter (Signed)
Request for Adderall Last Rx: 05.14.14, #30x0 Last OV: 01.31.14 Return: 6 Months Please advise on refills/SLS

## 2013-01-27 NOTE — Telephone Encounter (Signed)
Left msg on vm 01/26/13 needing refill on his Adderral and levothyroxine...Raechel Chute

## 2013-01-27 NOTE — Telephone Encounter (Signed)
LMOM with contact name and number RE: requested Rx ready for p/u Mon-Fri 8a-5p/SLS

## 2013-01-27 NOTE — Telephone Encounter (Signed)
ADDERALL XR

## 2013-01-27 NOTE — Telephone Encounter (Signed)
See rx. 

## 2013-01-27 NOTE — Telephone Encounter (Signed)
Jasmine December [facility] that adderall ready for pick up.  I did not see request for levothyroxine. Refill has been sent.

## 2013-02-25 ENCOUNTER — Telehealth: Payer: Self-pay | Admitting: *Deleted

## 2013-02-25 MED ORDER — AMPHETAMINE-DEXTROAMPHET ER 20 MG PO CP24: 20.0000 mg | ORAL_CAPSULE | ORAL | Status: DC

## 2013-02-25 NOTE — Telephone Encounter (Signed)
Informed patient that Zachary [name] will sign rx for adderall and that prescription would be ready for pick up. He also scheduled an appointment for 03/18/13

## 2013-02-25 NOTE — Telephone Encounter (Signed)
Pt left message requesting written rx of adderall xr. Rx last printed on 01/27/13. Rx printed and forwarded to Provider for signature. Pt also last seen in January and recommended 6 month follow up. Pt is due for appt. Please call pt to arrange.

## 2013-02-25 NOTE — Telephone Encounter (Signed)
Signed.

## 2013-02-26 NOTE — Telephone Encounter (Signed)
Left message on pt's cell# that rx is ready for pick up at the front desk.

## 2013-03-18 ENCOUNTER — Ambulatory Visit (INDEPENDENT_AMBULATORY_CARE_PROVIDER_SITE_OTHER): Payer: BC Managed Care – PPO | Admitting: Family

## 2013-03-18 ENCOUNTER — Encounter: Payer: Self-pay | Admitting: Family

## 2013-03-18 VITALS — BP 126/90 | HR 67 | Temp 98.5°F | Resp 18 | Ht 70.0 in | Wt 200.1 lb

## 2013-03-18 DIAGNOSIS — E032 Hypothyroidism due to medicaments and other exogenous substances: Secondary | ICD-10-CM

## 2013-03-18 DIAGNOSIS — N529 Male erectile dysfunction, unspecified: Secondary | ICD-10-CM | POA: Insufficient documentation

## 2013-03-18 DIAGNOSIS — J069 Acute upper respiratory infection, unspecified: Secondary | ICD-10-CM

## 2013-03-18 DIAGNOSIS — F988 Other specified behavioral and emotional disorders with onset usually occurring in childhood and adolescence: Secondary | ICD-10-CM

## 2013-03-18 DIAGNOSIS — E039 Hypothyroidism, unspecified: Secondary | ICD-10-CM

## 2013-03-18 MED ORDER — SILDENAFIL CITRATE 50 MG PO TABS: 50.0000 mg | ORAL_TABLET | Freq: Every day | ORAL | Status: DC | PRN

## 2013-03-18 MED ORDER — AMPHETAMINE-DEXTROAMPHET ER 20 MG PO CP24: 20.0000 mg | ORAL_CAPSULE | ORAL | Status: DC

## 2013-03-18 NOTE — Assessment & Plan Note (Signed)
Clinically resolving. 

## 2013-03-18 NOTE — Assessment & Plan Note (Signed)
Stable on adderall, continue same.  

## 2013-03-18 NOTE — Patient Instructions (Addendum)
Please schedule follow up with Dr. Allena Katz. Return in AM for lab work. Follow up in 3 months.

## 2013-03-18 NOTE — Assessment & Plan Note (Signed)
Check TSH. I have advised pt to arrange follow up with Dr. Allena Katz as he has not been seen in 1 year and he agrees to do so.

## 2013-03-18 NOTE — Progress Notes (Signed)
Subjective:    Patient ID: Zachary [name], male    DOB: August 01, 1976, 37 y.o.   MRN: [id number]  HPI  Zachary [name] is a 37 yr old male who presents today for follow up.  1) Hypothyroid- hx of thyroid cancer. Maintained on levothyroxine.  He is followed by Dr. Allena Katz at [facility].  2) ADD- stable on adderall.    3) Nasal congestion- exposed to sick child.  Daughter had a cold.  He has been improving since Sunday.  Denies fever.    4) ED- notes that for the last 2-3 years he has been having more frequent episodes of ED.  Used to think that it was related to stress but no is having less stress. He reports normal libido.  Review of Systems See HPI  Past Medical History  Diagnosis Date  . History of papillary adenocarcinoma of thyroid     s/p thyroidectomy 06/2003  . Iatrogenic hyperthyroidism   . History of tobacco abuse   . ADD (attention deficit disorder)     History   Social History  . Marital Status: Married    Spouse Name: N/A    Number of Children: N/A  . Years of Education: N/A   Occupational History  . Not on file.   Social History Main Topics  . Smoking status: Former Games developer  . Smokeless tobacco: Not on file     Comment: Quit  about 7 years ago. 3-5 pack year history  . Alcohol Use: No  . Drug Use: Not on file  . Sexually Active: Not on file   Other Topics Concern  . 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  3 to 5 pack year history   Alcohol use-no          Past Surgical History  Procedure Laterality Date  . Thyroidectomy  06/2003  . Tonsillectomy  1985    Family History  Problem Relation Age of Onset  . Achalasia Father 71  . Coronary artery disease Paternal Grandmother   . Other Mother 32    healthy  . Hypertension Mother   . Stroke Maternal Grandfather   . Diabetes Neg Hx     No Known  Allergies  Current Outpatient Prescriptions on File Prior to Visit  Medication Sig Dispense Refill  . amphetamine-dextroamphetamine (ADDERALL XR) 20 MG 24 hr capsule Take 1 capsule (20 mg total) by mouth every morning.  30 capsule  0  . levothyroxine (SYNTHROID, LEVOTHROID) 150 MCG tablet TAKE 1 TABLET BY MOUTH ONCE DAILY  30 tablet  3   No current facility-administered medications on file prior to visit.    BP 126/90  Pulse 67  Temp(Src) 98.5 F (36.9 C) (Oral)  Resp 18  Ht 5\' 10"  (1.778 m)  Wt 200 lb 1.9 oz (90.774 kg)  BMI 28.71 kg/m2  SpO2 99%       Objective:   Physical Exam  Constitutional: He is oriented to person, place, and time. He appears well-developed and well-nourished. No distress.  HENT:  Head: Normocephalic and atraumatic.  Cardiovascular: Normal rate and regular rhythm.   No murmur heard. Pulmonary/Chest: Effort normal and breath sounds normal. No respiratory distress. He has no wheezes. He exhibits no tenderness.  Musculoskeletal: He exhibits no edema.  Neurological: He is alert and  oriented to person, place, and time.  Psychiatric: He has a normal mood and affect. His behavior is normal. Judgment normal.          Assessment & Plan:

## 2013-03-18 NOTE — Assessment & Plan Note (Signed)
Trial of viagra. Check AM testosterone level.

## 2013-03-19 LAB — TESTOSTERONE, FREE, TOTAL, SHBG: Testosterone: 254 ng/dL — ABNORMAL LOW (ref 300–890)

## 2013-03-19 LAB — TSH: TSH: 1.169 u[IU]/mL (ref 0.350–4.500)

## 2013-03-25 ENCOUNTER — Other Ambulatory Visit: Payer: Self-pay | Admitting: Family

## 2013-03-26 ENCOUNTER — Telehealth: Payer: Self-pay | Admitting: *Deleted

## 2013-03-26 NOTE — Telephone Encounter (Signed)
Received request for PA on Viagra 50 mg via pharmacy to be completed via Cover My Meds/[employer]; form completed & faxed on 08.08.14.  Approval received and pharmacy informed 08.14.2014 via Tricia/SLS

## 2013-04-28 ENCOUNTER — Telehealth: Payer: Self-pay | Admitting: *Deleted

## 2013-04-28 MED ORDER — AMPHETAMINE-DEXTROAMPHET ER 20 MG PO CP24: 20.0000 mg | ORAL_CAPSULE | ORAL | Status: DC

## 2013-04-28 NOTE — Telephone Encounter (Signed)
Signed.

## 2013-04-28 NOTE — Telephone Encounter (Signed)
Rx placed at front desk for pick up and pt notified. 

## 2013-04-28 NOTE — Telephone Encounter (Signed)
Received message from pt requesting rx for adderall xr. Would like to pick up today as he will take his last one today. Last rx printed 03/18/13 with note not to fill prior to 03/27/13. Rx printed and forwarded to Provider for signature.

## 2013-05-29 ENCOUNTER — Telehealth: Payer: Self-pay | Admitting: *Deleted

## 2013-05-29 MED ORDER — AMPHETAMINE-DEXTROAMPHET ER 20 MG PO CP24: 20.0000 mg | ORAL_CAPSULE | ORAL | Status: DC

## 2013-05-29 NOTE — Telephone Encounter (Signed)
Received message from pt requesting written rx of adderall. Will take last one today. Last rx printed on 04/28/13. Pt has follow up on 06/15/13. Rx printed and forwarded to Provider for signature.

## 2013-05-29 NOTE — Telephone Encounter (Signed)
Rx was signed and placed at the front desk. Pt was made aware.

## 2013-05-29 NOTE — Telephone Encounter (Signed)
Rx signed.

## 2013-06-15 ENCOUNTER — Ambulatory Visit: Payer: BC Managed Care – PPO | Admitting: Family

## 2013-06-22 ENCOUNTER — Ambulatory Visit (INDEPENDENT_AMBULATORY_CARE_PROVIDER_SITE_OTHER): Payer: BC Managed Care – PPO | Admitting: Family

## 2013-06-22 ENCOUNTER — Encounter: Payer: Self-pay | Admitting: Family

## 2013-06-22 VITALS — BP 120/82 | HR 63 | Temp 98.4°F | Resp 16 | Ht 70.0 in | Wt 196.0 lb

## 2013-06-22 DIAGNOSIS — E032 Hypothyroidism due to medicaments and other exogenous substances: Secondary | ICD-10-CM

## 2013-06-22 MED ORDER — LEVOTHYROXINE SODIUM 150 MCG PO TABS: ORAL_TABLET | ORAL | Status: DC

## 2013-06-22 MED ORDER — AMPHETAMINE-DEXTROAMPHET ER 20 MG PO CP24: 20.0000 mg | ORAL_CAPSULE | ORAL | Status: DC

## 2013-06-22 NOTE — Progress Notes (Signed)
Subjective:    Patient ID: Zachary [name], male    DOB: 02-29-1976, 37 y.o.   MRN: [id number]  HPI Mr. Rybicki is a 37 year old male who presents today for follow up.  1) Hypothyroidism: Follow's with Dr. Allena Katz, patient has not seen Allena Katz in one year.  Patient is to set up follow up appointment. Patient taking levothyroxine and needs refill.  2) ADD: Maintained on Adderall, requesting refills. Reports no issue with attention on this dose.   3) Diet and exercise: Patient reports and increase in exercise and has been trying to eat healthy.   Review of Systems  Constitutional: Negative for fever.  HENT: Negative for congestion and sore throat.   Respiratory: Negative for cough and shortness of breath.   Cardiovascular: Negative for chest pain.  Endocrine: Negative for cold intolerance and heat intolerance.  Neurological: Negative for headaches.  Psychiatric/Behavioral: Negative for decreased concentration.   Past Medical History  Diagnosis Date  . History of papillary adenocarcinoma of thyroid     s/p thyroidectomy 06/2003  . Iatrogenic hyperthyroidism   . History of tobacco abuse   . ADD (attention deficit disorder)     History   Social History  . Marital Status: Married    Spouse Name: N/A    Number of Children: N/A  . Years of Education: N/A   Occupational History  . Not on file.   Social History Main Topics  . Smoking status: Former Games developer  . Smokeless tobacco: Not on file     Comment: Quit  about 7 years ago. 3-5 pack year history  . Alcohol Use: No  . Drug Use: Not on file  . Sexual Activity: Not on file   Other Topics Concern  . 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  3 to 5 pack year history   Alcohol use-no          Past Surgical History  Procedure Laterality Date  . Thyroidectomy  06/2003  . Tonsillectomy  1985     Family History  Problem Relation Age of Onset  . Achalasia Father 39  . Coronary artery disease Paternal Grandmother   . Other Mother 36    healthy  . Hypertension Mother   . Stroke Maternal Grandfather   . Diabetes Neg Hx     No Known Allergies  Current Outpatient Prescriptions on File Prior to Visit  Medication Sig Dispense Refill  . sildenafil (VIAGRA) 50 MG tablet Take 1 tablet (50 mg total) by mouth daily as needed for erectile dysfunction.  10 tablet  2   No current facility-administered medications on file prior to visit.    BP 120/82  Pulse 63  Temp(Src) 98.4 F (36.9 C) (Oral)  Resp 16  Ht 5\' 10"  (1.778 m)  Wt 196 lb (88.905 kg)  BMI 28.12 kg/m2  SpO2 99%       Objective:   Physical Exam  Constitutional: He is oriented to person, place, and time. He appears well-nourished.  Neck: Neck supple. No thyromegaly present.  Cardiovascular: Normal rate, regular rhythm and normal heart sounds.   No murmur heard. Pulmonary/Chest: Effort normal and breath sounds normal. No respiratory distress. He has no wheezes.  Lymphadenopathy:    He has no cervical adenopathy.  Neurological: He is alert and  oriented to person, place, and time.  Skin: Skin is warm and dry.  Psychiatric: He has a normal mood and affect.          Assessment & Plan:

## 2013-06-22 NOTE — Progress Notes (Signed)
Pre visit review using our clinic review tool, if applicable. No additional management support is needed unless otherwise documented below in the visit note. 

## 2013-06-22 NOTE — Assessment & Plan Note (Signed)
Continue levothyroxine.  Refilled RX today, make follow up appointment with Dr. Allena Katz

## 2013-06-22 NOTE — Patient Instructions (Signed)
Please schedule follow up with Dr. Allena Katz. Follow up with [place] in 6 months.

## 2013-07-27 ENCOUNTER — Telehealth: Payer: Self-pay | Admitting: *Deleted

## 2013-07-27 MED ORDER — AMPHETAMINE-DEXTROAMPHET ER 20 MG PO CP24: 20.0000 mg | ORAL_CAPSULE | ORAL | Status: DC

## 2013-07-27 NOTE — Telephone Encounter (Signed)
Pt called requesting refill of adderall. Last rx provided on 06/22/13.  Rx printed and forwarded to Provider for signature.

## 2013-07-27 NOTE — Telephone Encounter (Signed)
Left detailed message on voicemail that rx was ready for pick up at the front desk.

## 2013-08-26 ENCOUNTER — Telehealth: Payer: Self-pay | Admitting: *Deleted

## 2013-08-26 MED ORDER — AMPHETAMINE-DEXTROAMPHET ER 20 MG PO CP24: 20.0000 mg | ORAL_CAPSULE | ORAL | Status: DC

## 2013-08-26 NOTE — Telephone Encounter (Signed)
Signed.

## 2013-08-26 NOTE — Telephone Encounter (Signed)
Received call from pt requesting refill of adderall.  Last rx printed 07/27/13.  Rx printed and forwarded to Provider for signature.

## 2013-08-26 NOTE — Telephone Encounter (Signed)
Rx sent to front desk and pt notified.

## 2013-09-24 ENCOUNTER — Telehealth: Payer: Self-pay | Admitting: Family

## 2013-09-24 NOTE — Telephone Encounter (Signed)
Needs his adderall refill to be picked up on 09-25-2013 as he will be out on Saturday

## 2013-09-25 MED ORDER — AMPHETAMINE-DEXTROAMPHET ER 20 MG PO CP24: 20.0000 mg | ORAL_CAPSULE | ORAL | Status: DC

## 2013-09-25 NOTE — Telephone Encounter (Signed)
See rx. 

## 2013-09-25 NOTE — Addendum Note (Signed)
Addended by: Debbrah Alar on: 09/25/2013 10:40 AM   Modules accepted: Orders

## 2013-09-25 NOTE — Telephone Encounter (Signed)
Rx sent to front desk. Left detailed message pt's voicemail re: completion and to call if any questions.

## 2013-09-25 NOTE — Telephone Encounter (Signed)
Please advise refill? Last RX was done on 08-26-13 quantity 30 with 0 refills

## 2013-10-26 ENCOUNTER — Telehealth: Payer: Self-pay | Admitting: Family

## 2013-10-26 MED ORDER — AMPHETAMINE-DEXTROAMPHET ER 20 MG PO CP24: 20.0000 mg | ORAL_CAPSULE | ORAL | Status: DC

## 2013-10-26 NOTE — Telephone Encounter (Signed)
Rx sent to front desk for pick up. Pt notified. 

## 2013-10-26 NOTE — Telephone Encounter (Signed)
Last rx printed 09/25/13. Pt has f/u in may.  Rx printed and forwarded to Provider for signature.

## 2013-10-26 NOTE — Telephone Encounter (Signed)
TIME FOR HIS ADDERALL RX

## 2013-11-24 ENCOUNTER — Telehealth: Payer: Self-pay | Admitting: *Deleted

## 2013-11-24 MED ORDER — AMPHETAMINE-DEXTROAMPHET ER 20 MG PO CP24: 20.0000 mg | ORAL_CAPSULE | ORAL | Status: DC

## 2013-11-24 NOTE — Telephone Encounter (Signed)
Pt left message requesting refill of Adderall Rx.  Pt has f/u in May and last Rx was printed 10/26/13. Rx printed and forwarded to Provider for signature.

## 2013-11-24 NOTE — Telephone Encounter (Signed)
Rx sent to front desk for pick up. Left detailed message on pt's cell#.

## 2013-12-14 ENCOUNTER — Encounter: Payer: Self-pay | Admitting: Family

## 2013-12-14 ENCOUNTER — Ambulatory Visit (INDEPENDENT_AMBULATORY_CARE_PROVIDER_SITE_OTHER): Payer: BC Managed Care – PPO | Admitting: Family

## 2013-12-14 VITALS — BP 130/88 | HR 67 | Temp 98.6°F | Resp 16 | Ht 70.0 in | Wt 193.0 lb

## 2013-12-14 DIAGNOSIS — F988 Other specified behavioral and emotional disorders with onset usually occurring in childhood and adolescence: Secondary | ICD-10-CM

## 2013-12-14 DIAGNOSIS — Z85850 Personal history of malignant neoplasm of thyroid: Secondary | ICD-10-CM

## 2013-12-14 DIAGNOSIS — E032 Hypothyroidism due to medicaments and other exogenous substances: Secondary | ICD-10-CM

## 2013-12-14 MED ORDER — AMPHETAMINE-DEXTROAMPHET ER 20 MG PO CP24: 20.0000 mg | ORAL_CAPSULE | ORAL | Status: DC

## 2013-12-14 NOTE — Assessment & Plan Note (Signed)
Stable on current dose of adderall.

## 2013-12-14 NOTE — Progress Notes (Signed)
Pre visit review using our clinic review tool, if applicable. No additional management support is needed unless otherwise documented below in the visit note. 

## 2013-12-14 NOTE — Patient Instructions (Signed)
Please schedule a fasting physical at your convenience.

## 2013-12-14 NOTE — Assessment & Plan Note (Signed)
Clinically stable.  Management per Endo due to hx of thyroid cancer.

## 2013-12-14 NOTE — Progress Notes (Signed)
Subjective:    Patient ID: Zachary [name], male    DOB: [birth year], 38 y.o.   MRN: [id number]  HPI  Zachary [name] is a 38 yr old male who present today for follow up.  1) Hypothyroid- followed by Dr. Posey Pronto.  Saw Dr. Posey Pronto after the first of the year.   Wt Readings from Last 3 Encounters:  12/14/13 193 lb (87.544 kg)  06/22/13 196 lb (88.905 kg)  03/18/13 200 lb 1.9 oz (90.774 kg)   2) ADD- currently maintained on adderall.  Review of Systems See HPI  Past Medical History  Diagnosis Date  . History of papillary adenocarcinoma of thyroid     s/p thyroidectomy 06/2003  . Iatrogenic hyperthyroidism   . History of tobacco abuse   . ADD (attention deficit disorder)     History   Social History  . Marital Status: Married    Spouse Name: N/A    Number of Children: N/A  . Years of Education: N/A   Occupational History  . Not on file.   Social History Main Topics  . Smoking status: Former Research scientist (life sciences)  . Smokeless tobacco: Not on file     Comment: Quit  about 7 years ago. 3-5 pack year history  . Alcohol Use: No  . Drug Use: Not on file  . Sexual Activity: Not on file   Other Topics Concern  . 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  3 to 5 pack year history   Alcohol use-no          Past Surgical History  Procedure Laterality Date  . Thyroidectomy  06/2003  . Tonsillectomy  1985    Family History  Problem Relation Age of Onset  . Achalasia Father 39  . Coronary artery disease Paternal Grandmother   . Other Mother 35    healthy  . Hypertension Mother   . Stroke Maternal Grandfather   . Diabetes Neg Hx     No Known Allergies  Current Outpatient Prescriptions on File Prior to Visit  Medication Sig Dispense Refill  . amphetamine-dextroamphetamine (ADDERALL XR) 20 MG 24 hr capsule Take 1 capsule (20 mg total) by mouth every  morning.  30 capsule  0  . levothyroxine (SYNTHROID, LEVOTHROID) 150 MCG tablet TAKE 1 TABLET BY MOUTH ONCE DAILY  30 tablet  3  . sildenafil (VIAGRA) 50 MG tablet Take 1 tablet (50 mg total) by mouth daily as needed for erectile dysfunction.  10 tablet  2   No current facility-administered medications on file prior to visit.    BP 130/88  Pulse 67  Temp(Src) 98.6 F (37 C) (Oral)  Resp 16  Ht 5\' 10"  (1.778 m)  Wt 193 lb (87.544 kg)  BMI 27.69 kg/m2  SpO2 99%       Objective:   Physical Exam  Constitutional: He is oriented to person, place, and time. He appears well-developed and well-nourished. No distress.  Eyes: Conjunctivae are normal. Pupils are equal, round, and reactive to light.  Neck: Neck supple.  Cardiovascular: Normal rate and regular rhythm.   No murmur heard. Pulmonary/Chest: Effort normal. No respiratory distress. He has no wheezes. He has no rales. He exhibits no tenderness.  Abdominal: Soft.  Musculoskeletal: He exhibits no edema.  Neurological: He is alert and oriented  to person, place, and time.  Psychiatric: He has a normal mood and affect. His behavior is normal. Thought content normal.          Assessment & Plan:

## 2014-01-22 ENCOUNTER — Telehealth: Payer: Self-pay | Admitting: *Deleted

## 2014-01-22 MED ORDER — AMPHETAMINE-DEXTROAMPHET ER 20 MG PO CP24: 20.0000 mg | ORAL_CAPSULE | ORAL | Status: DC

## 2014-01-22 NOTE — Telephone Encounter (Signed)
Pt left message requesting refill of adderall. Last Rx 12/22/13.  Rx printed and forwarded to Provider for signature.

## 2014-01-22 NOTE — Telephone Encounter (Signed)
Rx forwarded to front desk for pick up and detailed message left on pt's voicemail.

## 2014-02-01 ENCOUNTER — Encounter: Payer: Self-pay | Admitting: Family

## 2014-02-01 ENCOUNTER — Ambulatory Visit (INDEPENDENT_AMBULATORY_CARE_PROVIDER_SITE_OTHER): Payer: BC Managed Care – PPO | Admitting: Family

## 2014-02-01 VITALS — BP 120/80 | HR 50 | Temp 97.9°F | Ht 70.0 in | Wt 198.0 lb

## 2014-02-01 DIAGNOSIS — Z0000 Encounter for general adult medical examination without abnormal findings: Secondary | ICD-10-CM

## 2014-02-01 LAB — HEPATIC FUNCTION PANEL
ALT: 19 U/L (ref 0–53)
AST: 13 U/L (ref 0–37)
Albumin: 4.7 g/dL (ref 3.5–5.2)
Alkaline Phosphatase: 80 U/L (ref 39–117)
Bilirubin, Direct: 0.1 mg/dL (ref 0.0–0.3)
Indirect Bilirubin: 0.4 mg/dL (ref 0.2–1.2)
Total Bilirubin: 0.5 mg/dL (ref 0.2–1.2)
Total Protein: 7 g/dL (ref 6.0–8.3)

## 2014-02-01 LAB — CBC WITH DIFFERENTIAL/PLATELET
BASOS ABS: 0.1 10*3/uL (ref 0.0–0.1)
Basophils Relative: 1 % (ref 0–1)
Eosinophils Absolute: 0.3 10*3/uL (ref 0.0–0.7)
Eosinophils Relative: 5 % (ref 0–5)
HEMATOCRIT: 44.6 % (ref 39.0–52.0)
Hemoglobin: 15.9 g/dL (ref 13.0–17.0)
LYMPHS ABS: 1.9 10*3/uL (ref 0.7–4.0)
LYMPHS PCT: 31 % (ref 12–46)
MCH: 29.9 pg (ref 26.0–34.0)
MCHC: 35.7 g/dL (ref 30.0–36.0)
MCV: 84 fL (ref 78.0–100.0)
MONO ABS: 0.4 10*3/uL (ref 0.1–1.0)
Monocytes Relative: 7 % (ref 3–12)
Neutro Abs: 3.4 10*3/uL (ref 1.7–7.7)
Neutrophils Relative %: 56 % (ref 43–77)
PLATELETS: 287 10*3/uL (ref 150–400)
RBC: 5.31 MIL/uL (ref 4.22–5.81)
RDW: 13.4 % (ref 11.5–15.5)
WBC: 6 10*3/uL (ref 4.0–10.5)

## 2014-02-01 LAB — BASIC METABOLIC PANEL WITH GFR
BUN: 15 mg/dL (ref 6–23)
CHLORIDE: 102 meq/L (ref 96–112)
CO2: 29 meq/L (ref 19–32)
CREATININE: 0.94 mg/dL (ref 0.50–1.35)
Calcium: 9.9 mg/dL (ref 8.4–10.5)
GFR, Est African American: >89 mL/min
GFR, Est Non African American: >89 mL/min
Glucose, Bld: 94 mg/dL (ref 70–99)
Potassium: 4.6 mEq/L (ref 3.5–5.3)
SODIUM: 138 meq/L (ref 135–145)

## 2014-02-01 LAB — LIPID PANEL
Cholesterol: 197 mg/dL (ref 0–200)
HDL: 46 mg/dL
LDL Cholesterol: 126 mg/dL — ABNORMAL HIGH (ref 0–99)
Total CHOL/HDL Ratio: 4.3 ratio
Triglycerides: 126 mg/dL
VLDL: 25 mg/dL (ref 0–40)

## 2014-02-01 LAB — TSH: TSH: 1.455 u[IU]/mL (ref 0.350–4.500)

## 2014-02-01 NOTE — Assessment & Plan Note (Signed)
Discussed healthy diet, exercise.  Obtain fasting lab work.

## 2014-02-01 NOTE — Progress Notes (Signed)
Pre visit review using our clinic review tool, if applicable. No additional management support is needed unless otherwise documented below in the visit note. 

## 2014-02-01 NOTE — Progress Notes (Signed)
Subjective:    Patient ID: Zachary [name], male    DOB: 14-Jun-1976, 38 y.o.   MRN: [id number]  HPI  Zachary [name] is a 38 yr old male presents today for cpx.  Patient presents today for complete physical.  Immunizations:  Up to date Diet: fair diet  Exercise: walks several times a week  Review of Systems  Constitutional: Negative for unexpected weight change.  HENT: Negative for hearing loss and rhinorrhea.   Eyes: Negative for visual disturbance.  Respiratory: Negative for cough and shortness of breath.   Cardiovascular: Negative for chest pain.  Gastrointestinal: Negative for nausea and vomiting.  Genitourinary: Negative for dysuria and frequency.  Musculoskeletal: Negative for arthralgias and myalgias.  Skin: Negative for rash.  Neurological: Negative for headaches.  Hematological: Negative for adenopathy.  Psychiatric/Behavioral:       Denies anxiety or depression   Past Medical History  Diagnosis Date  . History of papillary adenocarcinoma of thyroid     s/p thyroidectomy 06/2003  . Iatrogenic hyperthyroidism   . History of tobacco abuse   . ADD (attention deficit disorder)     History   Social History  . Marital Status: Married    Spouse Name: N/A    Number of Children: N/A  . Years of Education: N/A   Occupational History  . Not on file.   Social History Main Topics  . Smoking status: Former Research scientist (life sciences)  . Smokeless tobacco: Not on file     Comment: Quit  about 7 years ago. 3-5 pack year history  . Alcohol Use: No  . Drug Use: Not on file  . Sexual Activity: Not on file   Other Topics Concern  . 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  3 to 5 pack year history   Alcohol use-no          Past Surgical History  Procedure Laterality Date  . Thyroidectomy  06/2003  . Tonsillectomy  1985    Family History    Problem Relation Age of Onset  . Achalasia Father 79  . Coronary artery disease Paternal Grandmother   . Other Mother 62    healthy  . Hypertension Mother   . Stroke Maternal Grandfather   . Diabetes Neg Hx     No Known Allergies  Current Outpatient Prescriptions on File Prior to Visit  Medication Sig Dispense Refill  . amphetamine-dextroamphetamine (ADDERALL XR) 20 MG 24 hr capsule Take 1 capsule (20 mg total) by mouth every morning.  30 capsule  0  . levothyroxine (SYNTHROID, LEVOTHROID) 150 MCG tablet TAKE 1 TABLET BY MOUTH ONCE DAILY  30 tablet  3  . sildenafil (VIAGRA) 50 MG tablet Take 1 tablet (50 mg total) by mouth daily as needed for erectile dysfunction.  10 tablet  2   No current facility-administered medications on file prior to visit.    BP 120/80  Pulse 50  Temp(Src) 97.9 F (36.6 C) (Oral)  Ht 5\' 10"  (1.778 m)  Wt 198 lb (89.812 kg)  BMI 28.41 kg/m2  SpO2 97%        Objective:   Physical Exam  Constitutional: He is oriented to person, place, and time. He appears well-developed and well-nourished. No distress.  HENT:  Head: Normocephalic and atraumatic.  Cardiovascular: Normal rate  and regular rhythm.   No murmur heard. Pulmonary/Chest: Effort normal and breath sounds normal. No respiratory distress. He has no wheezes. He has no rales. He exhibits no tenderness.  Neurological: He is alert and oriented to person, place, and time.  Psychiatric: He has a normal mood and affect. His behavior is normal. Judgment and thought content normal.          Assessment & Plan:

## 2014-02-01 NOTE — Patient Instructions (Signed)
Please complete lab work. Follow up in 6 months. Sooner if problems or concerns.

## 2014-02-02 ENCOUNTER — Encounter: Payer: Self-pay | Admitting: Family

## 2014-02-02 LAB — URINALYSIS, ROUTINE W REFLEX MICROSCOPIC
BILIRUBIN URINE: NEGATIVE
Glucose, UA: NEGATIVE mg/dL
HGB URINE DIPSTICK: NEGATIVE
KETONES UR: NEGATIVE mg/dL
Leukocytes, UA: NEGATIVE
Nitrite: NEGATIVE
Protein, ur: NEGATIVE mg/dL
Specific Gravity, Urine: 1.014 (ref 1.005–1.030)
UROBILINOGEN UA: 0.2 mg/dL (ref 0.0–1.0)
pH: 7 (ref 5.0–8.0)

## 2014-02-23 ENCOUNTER — Telehealth: Payer: Self-pay | Admitting: *Deleted

## 2014-02-23 MED ORDER — AMPHETAMINE-DEXTROAMPHET ER 20 MG PO CP24: 20.0000 mg | ORAL_CAPSULE | ORAL | Status: DC

## 2014-02-23 NOTE — Telephone Encounter (Signed)
Notified pt. 

## 2014-02-23 NOTE — Telephone Encounter (Signed)
Attempted to reach pt and recording stating voicemail box has not been set up. Unable to reach pt at work #. Rx sent to front desk for pick up.

## 2014-02-23 NOTE — Telephone Encounter (Signed)
Pt left message on 02/22/14 requesting written Rx of adderall. Last rx printed 01/22/14. Rx printed and forwarded to PR[facility] for signature.

## 2014-03-24 ENCOUNTER — Telehealth: Payer: Self-pay | Admitting: *Deleted

## 2014-03-24 MED ORDER — AMPHETAMINE-DEXTROAMPHET ER 20 MG PO CP24: 20.0000 mg | ORAL_CAPSULE | ORAL | Status: DC

## 2014-03-24 MED ORDER — LEVOTHYROXINE SODIUM 150 MCG PO TABS: ORAL_TABLET | ORAL | Status: DC

## 2014-03-24 NOTE — Telephone Encounter (Signed)
Received message from pt requesting Rx of Adderall.  Last Rx printed 02/23/14. Pt has upcoming appt on 07/19/14.  Rx printed and forwarded to Provider for signature.

## 2014-03-24 NOTE — Telephone Encounter (Signed)
Left detailed message on cell# that rx is ready for pick up at the front desk.

## 2014-03-26 ENCOUNTER — Other Ambulatory Visit: Payer: Self-pay | Admitting: Family

## 2014-03-26 NOTE — Telephone Encounter (Signed)
Rx sent to pharmacy.  Lov 02/01/14 Lrd 03/18/2013  LDM

## 2014-03-31 ENCOUNTER — Telehealth: Payer: Self-pay

## 2014-03-31 NOTE — Telephone Encounter (Signed)
PA for Viagra was approved effective 02-27-14 through 03-28-17

## 2014-04-23 ENCOUNTER — Telehealth: Payer: Self-pay | Admitting: Family

## 2014-04-23 MED ORDER — AMPHETAMINE-DEXTROAMPHET ER 20 MG PO CP24: 20.0000 mg | ORAL_CAPSULE | ORAL | Status: DC

## 2014-04-23 NOTE — Telephone Encounter (Signed)
Last rx printed 03/24/14. Rx printed and forwarded to PR[facility] for signature.

## 2014-04-23 NOTE — Telephone Encounter (Signed)
Caller name: Blain  Relation to pt: self Call back number: [phone]   Reason for call:   pt requesting a refill amphetamine-dextroamphetamine (ADDERALL XR) 20 MG 24 hr capsule pt would like to be notify when ready for p/u

## 2014-04-23 NOTE — Telephone Encounter (Signed)
Rx sent to front desk for pick up and detailed message left on cell.

## 2014-05-24 ENCOUNTER — Telehealth: Payer: Self-pay | Admitting: Family

## 2014-05-24 MED ORDER — AMPHETAMINE-DEXTROAMPHET ER 20 MG PO CP24: 20.0000 mg | ORAL_CAPSULE | ORAL | Status: DC

## 2014-05-24 NOTE — Telephone Encounter (Signed)
Rx last printed 04/23/14, pt has follow up in 07/2014. Rx printed and forwarded to Provider for signature.

## 2014-05-24 NOTE — Telephone Encounter (Signed)
Caller name: Lyfe Relation to pt: Call back number: [phone]   Reason for call: pt would like a refill on Rx amphetamine-dextroamphetamine (ADDERALL XR) 20 MG 24 hr capsule

## 2014-05-25 NOTE — Telephone Encounter (Signed)
Notified pt that Rx has been placed at front desk for pick up.

## 2014-06-23 ENCOUNTER — Telehealth: Payer: Self-pay | Admitting: Family

## 2014-06-23 NOTE — Telephone Encounter (Signed)
Pt requesting a refill   amphetamine-dextroamphetamine (ADDERALL XR) 20 MG 24 hr capsule

## 2014-06-24 MED ORDER — AMPHETAMINE-DEXTROAMPHET ER 20 MG PO CP24
20.0000 mg | ORAL_CAPSULE | ORAL | Status: DC
Start: 2014-06-24 — End: 2014-07-26

## 2014-06-24 NOTE — Telephone Encounter (Signed)
Pt has f/u 07/29/14.  Rx printed and forwarded to PR[facility] for signature.

## 2014-06-24 NOTE — Telephone Encounter (Signed)
Rx sent to front desk and message left on voicemail re: Rx completion.

## 2014-07-02 ENCOUNTER — Telehealth: Payer: Self-pay | Admitting: *Deleted

## 2014-07-02 MED ORDER — LEVOTHYROXINE SODIUM 150 MCG PO TABS: ORAL_TABLET | ORAL | Status: DC

## 2014-07-02 NOTE — Telephone Encounter (Signed)
Received fax from [place] requesting L-thyroxine 138mcg. Refill sent.

## 2014-07-19 ENCOUNTER — Ambulatory Visit: Payer: BC Managed Care – PPO | Admitting: Family

## 2014-07-26 ENCOUNTER — Encounter: Payer: Self-pay | Admitting: Family

## 2014-07-26 ENCOUNTER — Ambulatory Visit (INDEPENDENT_AMBULATORY_CARE_PROVIDER_SITE_OTHER): Payer: BC Managed Care – PPO | Admitting: Family

## 2014-07-26 VITALS — BP 138/87 | HR 60 | Temp 98.6°F | Resp 16 | Ht 70.0 in | Wt 205.8 lb

## 2014-07-26 DIAGNOSIS — F988 Other specified behavioral and emotional disorders with onset usually occurring in childhood and adolescence: Secondary | ICD-10-CM

## 2014-07-26 DIAGNOSIS — E039 Hypothyroidism, unspecified: Secondary | ICD-10-CM

## 2014-07-26 DIAGNOSIS — F909 Attention-deficit hyperactivity disorder, unspecified type: Secondary | ICD-10-CM

## 2014-07-26 DIAGNOSIS — N529 Male erectile dysfunction, unspecified: Secondary | ICD-10-CM

## 2014-07-26 MED ORDER — AMPHETAMINE-DEXTROAMPHET ER 20 MG PO CP24: 20.0000 mg | ORAL_CAPSULE | ORAL | Status: DC

## 2014-07-26 NOTE — Patient Instructions (Addendum)
Please schedule a lab visit for thyroid testing. Schedule follow up with Dr. Posey Pronto for march 2016. Follow up with [place] in 6 months.

## 2014-07-26 NOTE — Progress Notes (Signed)
Subjective:    Patient ID: Zachary [name], male    DOB: [birth year], 38 y.o.   MRN: [id number]  HPI  Zachary [name] is a 39 yr old male who presents today for follow up.  1) ADHD-  He is maintained on adderall xr. Feels like his concentration is stable on current dose.  Has been distracted.    2) Hypothyroid/hx thyroid cancer- he is followed by Endo  (Dr. Posey Pronto at [facility] and is followed annually).   3) ED- on daily cialis which is working well for him.    Review of Systems See HPI  Past Medical History  Diagnosis Date  . History of papillary adenocarcinoma of thyroid     s/p thyroidectomy 06/2003  . Iatrogenic hyperthyroidism   . History of tobacco abuse   . ADD (attention deficit disorder)     History   Social History  . Marital Status: Married    Spouse Name: N/A    Number of Children: N/A  . Years of Education: N/A   Occupational History  . Not on file.   Social History Main Topics  . Smoking status: Former Research scientist (life sciences)  . Smokeless tobacco: Not on file     Comment: Quit  about 7 years ago. 3-5 pack year history  . Alcohol Use: No  . Drug Use: Not on file  . Sexual Activity: Not on file   Other Topics Concern  . 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  3 to 5 pack year history   Alcohol use-no          Past Surgical History  Procedure Laterality Date  . Thyroidectomy  06/2003  . Tonsillectomy  1985    Family History  Problem Relation Age of Onset  . Achalasia Father 37  . Coronary artery disease Paternal Grandmother   . Other Mother 71    healthy  . Hypertension Mother   . Stroke Maternal Grandfather   . Diabetes Neg Hx     No Known Allergies  Current Outpatient Prescriptions on File Prior to Visit  Medication Sig Dispense Refill  . amphetamine-dextroamphetamine (ADDERALL XR) 20 MG 24 hr capsule Take 1  capsule (20 mg total) by mouth every morning. 30 capsule 0  . levothyroxine (SYNTHROID, LEVOTHROID) 150 MCG tablet TAKE 1 TABLET BY MOUTH ONCE DAILY 90 tablet 1   No current facility-administered medications on file prior to visit.    BP 138/87 mmHg  Pulse 60  Temp(Src) 98.6 F (37 C) (Oral)  Resp 16  Ht 5\' 10"  (1.778 m)  Wt 205 lb 12.8 oz (93.35 kg)  BMI 29.53 kg/m2  SpO2 99%       Objective:   Physical Exam  Constitutional: He is oriented to person, place, and time. He appears well-developed and well-nourished. No distress.  HENT:  Head: Normocephalic and atraumatic.  Neck: Neck supple.  Cardiovascular: Normal rate and regular rhythm.   No murmur heard. Pulmonary/Chest: Effort normal and breath sounds normal. No respiratory distress. He has no wheezes. He has no rales. He exhibits no tenderness.  Lymphadenopathy:    He has no cervical adenopathy.  Neurological: He is alert and oriented to person, place, and time.  Skin: Skin is warm and dry.  Psychiatric: He has a normal mood and affect. His  behavior is normal. Judgment and thought content normal.          Assessment & Plan:

## 2014-07-26 NOTE — Assessment & Plan Note (Signed)
Stable on adderall, unable to provide urine sample today for UDS, he will provide next visit at time of refill- pt aware.

## 2014-07-26 NOTE — Assessment & Plan Note (Signed)
Stable on daily cialis.

## 2014-07-26 NOTE — Assessment & Plan Note (Signed)
He will return for TSH

## 2014-07-26 NOTE — Progress Notes (Signed)
Pre visit review using our clinic review tool, if applicable. No additional management support is needed unless otherwise documented below in the visit note. 

## 2014-08-03 ENCOUNTER — Other Ambulatory Visit: Payer: BC Managed Care – PPO

## 2014-08-24 ENCOUNTER — Telehealth: Payer: Self-pay | Admitting: Family

## 2014-08-24 MED ORDER — AMPHETAMINE-DEXTROAMPHET ER 20 MG PO CP24: 20.0000 mg | ORAL_CAPSULE | ORAL | Status: DC

## 2014-08-24 NOTE — Telephone Encounter (Signed)
Rx last provided 07/26/14, #30 x no refills. Pt has f/u in 01/2015. Rx printed and forwarded to PR[facility]ider signature.

## 2014-08-24 NOTE — Telephone Encounter (Signed)
Caller name: brad  Relation to pt: self Call back number: [phone] Pharmacy:  Reason for call:   Requesting a new adderall rx

## 2014-08-24 NOTE — Telephone Encounter (Signed)
Notified pt rx ready for pick up at front desk.

## 2014-08-24 NOTE — Telephone Encounter (Signed)
rx signed

## 2014-09-24 ENCOUNTER — Other Ambulatory Visit: Payer: Self-pay | Admitting: Family

## 2014-09-24 ENCOUNTER — Encounter: Payer: Self-pay | Admitting: Family

## 2014-09-24 MED ORDER — AMPHETAMINE-DEXTROAMPHET ER 20 MG PO CP24: 20.0000 mg | ORAL_CAPSULE | ORAL | Status: DC

## 2014-09-24 NOTE — Telephone Encounter (Signed)
Caller name: brad Relation to pt: self Call back number: [phone] Pharmacy:  Reason for call:   Requesting a new adderall rx.

## 2014-09-24 NOTE — Telephone Encounter (Signed)
See rx.  Needs to sign controlled substance contract and due for UDS at time of pick up.

## 2014-09-24 NOTE — Telephone Encounter (Signed)
Called and spoke with the pt and informed him that his refill request has been approved and will be placed up front for him to pick up.  Also informed the pt that he will need to complete a controlled substance contract and do a UDS.  Pt verbalized understanding and agreed.  Rx placed up front.//AB/CMA

## 2014-09-24 NOTE — Telephone Encounter (Signed)
Last filled:08/24/14 Amt: 30, 0 refills Last OV:  07/26/14  Please advise.

## 2014-10-20 ENCOUNTER — Telehealth: Payer: Self-pay | Admitting: Family

## 2014-10-20 MED ORDER — AMPHETAMINE-DEXTROAMPHET ER 20 MG PO CP24: 20.0000 mg | ORAL_CAPSULE | ORAL | Status: DC

## 2014-10-20 NOTE — Telephone Encounter (Signed)
Rx placed at front desk for pick up and pt has been notified. 

## 2014-10-20 NOTE — Telephone Encounter (Signed)
rx signed

## 2014-10-20 NOTE — Telephone Encounter (Signed)
Pt has f/u 02/08/15. Last rx printed 09/24/14.  Rx printed and forwarded to Provider for signature.

## 2014-10-20 NOTE — Telephone Encounter (Signed)
Caller name: brad Relation to pt: self. Call back number: [phone] Pharmacy:  Reason for call:   Requesting a new adderall rx.

## 2014-11-23 ENCOUNTER — Telehealth: Payer: Self-pay | Admitting: Family

## 2014-11-23 MED ORDER — AMPHETAMINE-DEXTROAMPHET ER 20 MG PO CP24: 20.0000 mg | ORAL_CAPSULE | ORAL | Status: DC

## 2014-11-23 NOTE — Telephone Encounter (Signed)
Rx placed at front desk for pick up and pt has been notified. 

## 2014-11-23 NOTE — Telephone Encounter (Signed)
Last Rx given 10/20/14. F/u 02/08/15. CSC and UDS 09/2014.  Rx printed and forwarded to PR[facility]ider for signature.

## 2014-11-23 NOTE — Telephone Encounter (Signed)
Relation to pt: self  Call back number: [phone]   Reason for call:  Pt requesting a refill amphetamine-dextroamphetamine (ADDERALL XR) 20 MG 24 hr capsule

## 2014-12-21 ENCOUNTER — Telehealth: Payer: Self-pay | Admitting: Family

## 2014-12-21 MED ORDER — AMPHETAMINE-DEXTROAMPHET ER 20 MG PO CP24: 20.0000 mg | ORAL_CAPSULE | ORAL | Status: DC

## 2014-12-21 NOTE — Telephone Encounter (Signed)
Left detailed message on cell# and rx placed at front desk for pick up.

## 2014-12-21 NOTE — Telephone Encounter (Signed)
Last Rx given 11/23/14.  F/u 02/08/15. CSC and UDS 09/2014.  Rx printed and forwarded to Provider for signature.

## 2014-12-21 NOTE — Telephone Encounter (Signed)
Caller name: brad Relation to pt: self Call back number: [phone] Pharmacy:  Reason for call:   Patient requesting adderall rx

## 2015-01-20 ENCOUNTER — Telehealth: Payer: Self-pay | Admitting: Family

## 2015-01-20 NOTE — Telephone Encounter (Signed)
Caller name: brad Relation to pt: Call back number: [phone] Pharmacy:  Reason for call:   Requesting adderall refill

## 2015-01-21 MED ORDER — AMPHETAMINE-DEXTROAMPHET ER 20 MG PO CP24: 20.0000 mg | ORAL_CAPSULE | ORAL | Status: DC

## 2015-01-21 NOTE — Telephone Encounter (Signed)
Rx placed at front desk for pick up and detailed message left on pt's cell#.

## 2015-01-21 NOTE — Telephone Encounter (Signed)
Pt has f/u 02/08/15.  Last Rx given 12/21/14. UDS (moderate) and CSC 09/2014. Rx printed and forwarded to Provider for signature.

## 2015-02-08 ENCOUNTER — Ambulatory Visit: Payer: BLUE CROSS/BLUE SHIELD | Admitting: Family

## 2015-02-08 ENCOUNTER — Ambulatory Visit: Payer: BC Managed Care – PPO | Admitting: Family

## 2015-02-08 DIAGNOSIS — Z0289 Encounter for other administrative examinations: Secondary | ICD-10-CM

## 2015-02-09 ENCOUNTER — Encounter: Payer: Self-pay | Admitting: Family

## 2015-02-09 ENCOUNTER — Telehealth: Payer: Self-pay | Admitting: Family

## 2015-02-09 NOTE — Telephone Encounter (Signed)
Pt was no show 02/08/15 9:45am, follow up 15 appt, pt has not rescheduled, mailing letter, charge?

## 2015-02-09 NOTE — Telephone Encounter (Signed)
Yes please

## 2015-02-21 ENCOUNTER — Telehealth: Payer: Self-pay | Admitting: Family

## 2015-02-21 MED ORDER — AMPHETAMINE-DEXTROAMPHET ER 20 MG PO CP24: 20.0000 mg | ORAL_CAPSULE | ORAL | Status: DC

## 2015-02-21 NOTE — Telephone Encounter (Signed)
Caller name: Daxx Tiggs Relationship to patient: self Can be reached: [phone]  Reason for call: Pt needing refill on adderall 20mg  24 hr. Has 1 for tomorrow. Please call when ready to pick up.

## 2015-02-21 NOTE — Telephone Encounter (Signed)
Last Rx 01/21/15.  CSC and UDS on file from 09/2014. Pt has f/u 02/23/15.  Rx printed and forwarded to Provider for signature.

## 2015-02-22 NOTE — Telephone Encounter (Signed)
Rx placed at front desk and pt notified.

## 2015-02-23 ENCOUNTER — Ambulatory Visit (INDEPENDENT_AMBULATORY_CARE_PROVIDER_SITE_OTHER): Payer: BLUE CROSS/BLUE SHIELD | Admitting: Family

## 2015-02-23 ENCOUNTER — Encounter: Payer: Self-pay | Admitting: Family

## 2015-02-23 VITALS — BP 122/86 | HR 58 | Temp 98.6°F | Resp 16 | Ht 70.0 in | Wt 198.6 lb

## 2015-02-23 DIAGNOSIS — N529 Male erectile dysfunction, unspecified: Secondary | ICD-10-CM | POA: Diagnosis not present

## 2015-02-23 DIAGNOSIS — F988 Other specified behavioral and emotional disorders with onset usually occurring in childhood and adolescence: Secondary | ICD-10-CM

## 2015-02-23 DIAGNOSIS — F909 Attention-deficit hyperactivity disorder, unspecified type: Secondary | ICD-10-CM

## 2015-02-23 DIAGNOSIS — E039 Hypothyroidism, unspecified: Secondary | ICD-10-CM

## 2015-02-23 NOTE — Assessment & Plan Note (Signed)
Stable on adderall, continue same, obtain UDS today.

## 2015-02-23 NOTE — Patient Instructions (Addendum)
Please to to lab to complete UDS. Schedule complete physical at your convenience.

## 2015-02-23 NOTE — Progress Notes (Signed)
Pre visit review using our clinic review tool, if applicable. No additional management support is needed unless otherwise documented below in the visit note. 

## 2015-02-23 NOTE — Assessment & Plan Note (Signed)
Stable on prn cialis.

## 2015-02-23 NOTE — Progress Notes (Signed)
Subjective:    Patient ID: Zachary [name], male    DOB: October 27, 1975, 39 y.o.   MRN: [id number]  HPI  Zachary [name] is a 39 yr old male who presents today for follow up.  1) ADHD- continues on adderall xr. Pt reports that Adderall seems to be helping. Reports that his work performance is good. Able to complete tasks at school and at home.  2) Hx thyroid CA/hypothyroid- maintained on synthroid. He is managed by Zachary [name] at [facility]. Reports that he is up to date with Zachary [name].  Reports that synthroid was increased to 175 at his last visit.    Lab Results  Component Value Date   TSH 1.455 02/01/2014   3) ED- continues cialis prn. Reports that he continues to have good results from cialis.   Review of Systems Past Medical History  Diagnosis Date  . History of papillary adenocarcinoma of thyroid     s/p thyroidectomy 06/2003  . Iatrogenic hyperthyroidism   . History of tobacco abuse   . ADD (attention deficit disorder)     History   Social History  . Marital Status: Married    Spouse Name: N/A  . Number of Children: N/A  . Years of Education: N/A   Occupational History  . Not on file.   Social History Main Topics  . Smoking status: Former Research scientist (life sciences)  . Smokeless tobacco: Not on file     Comment: Quit  about 7 years ago. 3-5 pack year history  . Alcohol Use: No  . Drug Use: Not on file  . Sexual Activity: Not on file   Other Topics Concern  . 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  3 to 5 pack year history   Alcohol use-no          Past Surgical History  Procedure Laterality Date  . Thyroidectomy  06/2003  . Tonsillectomy  1985    Family History  Problem Relation Age of Onset  . Achalasia Father 68  . Coronary artery disease Paternal Grandmother   . Other Mother 29    healthy  . Hypertension Mother   . Stroke Maternal  Grandfather   . Diabetes Neg Hx     No Known Allergies  Current Outpatient Prescriptions on File Prior to Visit  Medication Sig Dispense Refill  . amphetamine-dextroamphetamine (ADDERALL XR) 20 MG 24 hr capsule Take 1 capsule (20 mg total) by mouth every morning. 30 capsule 0  . CIALIS 5 MG tablet Take 2.5 mg by mouth daily.    . clomiPHENE (CLOMID) 50 MG tablet Take 25 mg by mouth daily.     No current facility-administered medications on file prior to visit.    BP 122/86 mmHg  Pulse 58  Temp(Src) 98.6 F (37 C) (Oral)  Resp 16  Ht 5\' 10"  (1.778 m)  Wt 198 lb 9.6 oz (90.084 kg)  BMI 28.50 kg/m2  SpO2 97%       Objective:   Physical Exam  Constitutional: He is oriented to person, place, and time. He appears well-developed and well-nourished. No distress.  HENT:  Head: Normocephalic and atraumatic.  Cardiovascular: Normal rate and regular rhythm.   No murmur heard. Pulmonary/Chest: Effort normal and breath sounds normal. No respiratory distress. He has no wheezes. He has  no rales.  Musculoskeletal: He exhibits no edema.  Neurological: He is alert and oriented to person, place, and time.  Skin: Skin is warm and dry.  Psychiatric: He has a normal mood and affect. His behavior is normal. Thought content normal.          Assessment & Plan:

## 2015-02-23 NOTE — Assessment & Plan Note (Signed)
Clinically stable, management per psychiatry.

## 2015-03-24 ENCOUNTER — Telehealth: Payer: Self-pay | Admitting: Family

## 2015-03-24 NOTE — Telephone Encounter (Signed)
Caller name:Gaberial Relationship to patient:self Can be reached: Pharmacy:  Reason for call:Adderall xr

## 2015-03-25 MED ORDER — AMPHETAMINE-DEXTROAMPHET ER 20 MG PO CP24: 20.0000 mg | ORAL_CAPSULE | ORAL | Status: DC

## 2015-03-25 NOTE — Telephone Encounter (Signed)
Refill left at the front desk. Left message for pt tat rx is ready for pick up.

## 2015-03-25 NOTE — Telephone Encounter (Signed)
Requesting:  adderall Contract  09/24/14 UDS  Moderate Last OV  02/23/15 Last Refill   #30 02/21/15  Please Advise

## 2015-04-01 ENCOUNTER — Telehealth: Payer: Self-pay | Admitting: Family

## 2015-04-01 NOTE — Telephone Encounter (Signed)
Pre visit letter mailed 04/01/15

## 2015-04-20 ENCOUNTER — Telehealth: Payer: Self-pay | Admitting: Behavioral Health

## 2015-04-20 NOTE — Telephone Encounter (Signed)
Unable to reach patient at time of Pre-Visit Call.  Left message for patient to return call when available.    

## 2015-04-22 ENCOUNTER — Encounter: Payer: Self-pay | Admitting: Family

## 2015-04-22 ENCOUNTER — Ambulatory Visit (INDEPENDENT_AMBULATORY_CARE_PROVIDER_SITE_OTHER): Payer: BLUE CROSS/BLUE SHIELD | Admitting: Family

## 2015-04-22 VITALS — BP 130/84 | HR 54 | Temp 97.8°F | Resp 16 | Ht 69.5 in | Wt 196.8 lb

## 2015-04-22 DIAGNOSIS — Z0000 Encounter for general adult medical examination without abnormal findings: Secondary | ICD-10-CM | POA: Diagnosis not present

## 2015-04-22 LAB — BASIC METABOLIC PANEL
BUN: 14 mg/dL (ref 6–23)
CO2: 30 meq/L (ref 19–32)
Calcium: 9.7 mg/dL (ref 8.4–10.5)
Chloride: 103 mEq/L (ref 96–112)
Creatinine, Ser: 1.05 mg/dL (ref 0.40–1.50)
GFR: 83.53 mL/min (ref >60.00)
GLUCOSE: 96 mg/dL (ref 70–99)
POTASSIUM: 4.4 meq/L (ref 3.5–5.1)
SODIUM: 142 meq/L (ref 135–145)

## 2015-04-22 LAB — URINALYSIS, ROUTINE W REFLEX MICROSCOPIC
Bilirubin Urine: NEGATIVE
HGB URINE DIPSTICK: NEGATIVE
Ketones, ur: NEGATIVE
LEUKOCYTES UA: NEGATIVE
NITRITE: NEGATIVE
Specific Gravity, Urine: <=1.005 — AB (ref 1.000–1.030)
TOTAL PROTEIN, URINE-UPE24: NEGATIVE
URINE GLUCOSE: NEGATIVE
Urobilinogen, UA: 0.2 (ref 0.0–1.0)
WBC, UA: NONE SEEN (ref 0–?)
pH: 7 (ref 5.0–8.0)

## 2015-04-22 LAB — CBC WITH DIFFERENTIAL/PLATELET
BASOS PCT: 0.4 % (ref 0.0–3.0)
Basophils Absolute: 0 10*3/uL (ref 0.0–0.1)
EOS PCT: 5.4 % — AB (ref 0.0–5.0)
Eosinophils Absolute: 0.4 10*3/uL (ref 0.0–0.7)
HCT: 49.1 % (ref 39.0–52.0)
Hemoglobin: 16.6 g/dL (ref 13.0–17.0)
LYMPHS ABS: 2.4 10*3/uL (ref 0.7–4.0)
Lymphocytes Relative: 28.6 % (ref 12.0–46.0)
MCHC: 33.9 g/dL (ref 30.0–36.0)
MCV: 86.6 fl (ref 78.0–100.0)
MONOS PCT: 6.3 % (ref 3.0–12.0)
Monocytes Absolute: 0.5 10*3/uL (ref 0.1–1.0)
NEUTROS ABS: 4.9 10*3/uL (ref 1.4–7.7)
NEUTROS PCT: 59.3 % (ref 43.0–77.0)
PLATELETS: 266 10*3/uL (ref 150.0–400.0)
RBC: 5.67 Mil/uL (ref 4.22–5.81)
RDW: 13.2 % (ref 11.5–15.5)
WBC: 8.3 10*3/uL (ref 4.0–10.5)

## 2015-04-22 LAB — HEPATIC FUNCTION PANEL
ALBUMIN: 4.6 g/dL (ref 3.5–5.2)
ALT: 17 U/L (ref 0–53)
AST: 15 U/L (ref 0–37)
Alkaline Phosphatase: 57 U/L (ref 39–117)
Bilirubin, Direct: 0.1 mg/dL (ref 0.0–0.3)
Total Bilirubin: 0.5 mg/dL (ref 0.2–1.2)
Total Protein: 7.3 g/dL (ref 6.0–8.3)

## 2015-04-22 LAB — LIPID PANEL
Cholesterol: 165 mg/dL (ref 0–200)
HDL: 36.4 mg/dL — ABNORMAL LOW (ref >39.00)
LDL Cholesterol: 99 mg/dL (ref 0–99)
NONHDL: 129.05
Total CHOL/HDL Ratio: 5
Triglycerides: 148 mg/dL (ref 0.0–149.0)
VLDL: 29.6 mg/dL (ref 0.0–40.0)

## 2015-04-22 MED ORDER — AMPHETAMINE-DEXTROAMPHET ER 20 MG PO CP24: 20.0000 mg | ORAL_CAPSULE | ORAL | Status: DC

## 2015-04-22 NOTE — Assessment & Plan Note (Signed)
Declines tetanus and flu today. Will consider next visit. Obtain routine lab work.  Defer thyroid management to his endocrinologist.

## 2015-04-22 NOTE — Progress Notes (Signed)
Subjective:    Patient ID: Zachary [name], male    DOB: [birth year], 39 y.o.   MRN: [id number]  HPI  Ms.  Maye is a 39 yr old male who presents today for cpx.   Patient presents today for complete physical.  Immunizations: declines tetanus/flu shot.   Diet: reports diet is generally healthy Exercise: plays soccer, coaches softball Vision:  Due Dental: due Wt Readings from Last 3 Encounters:  04/22/15 196 lb 12.8 oz (89.268 kg)  02/23/15 198 lb 9.6 oz (90.084 kg)  07/26/14 205 lb 12.8 oz (93.35 kg)    Review of Systems  Constitutional: Negative for unexpected weight change.  HENT: Negative for hearing loss and rhinorrhea.   Eyes: Negative for visual disturbance.  Respiratory: Negative for cough and shortness of breath.   Cardiovascular: Negative for chest pain.  Gastrointestinal: Negative for diarrhea, constipation and blood in stool.  Genitourinary: Negative for dysuria and frequency.  Musculoskeletal: Negative for myalgias and arthralgias.  Skin: Negative for rash.  Neurological: Negative for headaches.  Hematological: Negative for adenopathy.  Psychiatric/Behavioral:       Denies depression/anxiety   Past Medical History  Diagnosis Date  . History of papillary adenocarcinoma of thyroid     s/p thyroidectomy 06/2003  . Iatrogenic hyperthyroidism   . History of tobacco abuse   . ADD (attention deficit disorder)     Social History   Social History  . Marital Status: Married    Spouse Name: N/A  . Number of Children: N/A  . Years of Education: N/A   Occupational History  . Not on file.   Social History Main Topics  . Smoking status: Former Research scientist (life sciences)  . Smokeless tobacco: Not on file     Comment: Quit  about 7 years ago. 3-5 pack year history  . Alcohol Use: Yes     Comment: 2 drinks per week  . Drug Use: Not on file  . Sexual Activity: Not on file   Other Topics Concern  . 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  3 to 5 pack year history   Alcohol use-no          Past Surgical History  Procedure Laterality Date  . Thyroidectomy  06/2003  . Tonsillectomy  1985    Family History  Problem Relation Age of Onset  . Achalasia Father 15  . Coronary artery disease Paternal Grandmother   . Other Mother 60    healthy  . Hypertension Mother   . Stroke Maternal Grandfather   . Diabetes Neg Hx     No Known Allergies  Current Outpatient Prescriptions on File Prior to Visit  Medication Sig Dispense Refill  . CIALIS 5 MG tablet Take 2.5 mg by mouth daily.    . clomiPHENE (CLOMID) 50 MG tablet Take 25 mg by mouth daily.    Marland Kitchen levothyroxine (SYNTHROID, LEVOTHROID) 175 MCG tablet Take 175 mcg by mouth daily before breakfast.     No current facility-administered medications on file prior to visit.    BP 130/84 mmHg  Pulse 54  Temp(Src) 97.8 F (36.6 C) (Oral)  Resp 16  Ht 5' 9.5" (1.765 m)  Wt 196 lb 12.8 oz (89.268 kg)  BMI 28.66 kg/m2  SpO2 99%       Objective:  Physical Exam Physical Exam  Constitutional: He is oriented to person, place, and time. He appears well-developed and well-nourished. No distress.  HENT:  Head: Normocephalic and atraumatic.  Right Ear: Tympanic membrane and ear canal normal.  Left Ear: Tympanic membrane and ear canal normal.  Mouth/Throat: Oropharynx is clear and moist.  Eyes: Pupils are equal, round, and reactive to light. No scleral icterus.  Neck: Normal range of motion. No thyromegaly present.  Cardiovascular: Normal rate and regular rhythm.   No murmur heard. Pulmonary/Chest: Effort normal and breath sounds normal. No respiratory distress. He has no wheezes. He has no rales. He exhibits no tenderness.  Abdominal: Soft. Bowel sounds are normal. He exhibits no distension and no mass. There is no tenderness. There is no rebound and no guarding.    Musculoskeletal: He exhibits no edema.  Lymphadenopathy:    He has no cervical adenopathy.  Neurological: He is alert and oriented to person, place, and time. He has normal patellar reflexes. He exhibits normal muscle tone. Coordination normal.  Skin: Skin is warm and dry.  Psychiatric: He has a normal mood and affect. His behavior is normal. Judgment and thought content normal.          Assessment & Plan:         Assessment & Plan:         Assessment & Plan:

## 2015-04-22 NOTE — Progress Notes (Signed)
Pre visit review using our clinic review tool, if applicable. No additional management support is needed unless otherwise documented below in the visit note. 

## 2015-04-22 NOTE — Patient Instructions (Addendum)
Please schedule eye exam and dental exam. Follow up in 6 months.

## 2015-04-23 ENCOUNTER — Encounter: Payer: Self-pay | Admitting: Family

## 2015-05-25 ENCOUNTER — Telehealth: Payer: Self-pay | Admitting: Family

## 2015-05-25 MED ORDER — AMPHETAMINE-DEXTROAMPHET ER 20 MG PO CP24: 20.0000 mg | ORAL_CAPSULE | ORAL | Status: DC

## 2015-05-25 NOTE — Telephone Encounter (Signed)
Pt needing refill on Adderall. He has 2 days left (already took it today). Please call when RX ready for pick up @ [phone].

## 2015-05-25 NOTE — Telephone Encounter (Signed)
Notified pt and he voices understanding. Rx placed at front desk for pick up. 

## 2015-05-25 NOTE — Telephone Encounter (Signed)
Spoke with pt and advised him that urine currently needed is for UDS. Last urine obtained was for his CPE (urinalysis) different testing. Advised him he is currently being checked at 3 month interval and is due now for UDS. Pt states it is an inconvenience to have to come in now for UDS. Advised him that he has to pick up written Rx anyway and shouldn't take long to provide urine sample. He states he just thought it would have been more efficient to have collected specimen at his last appt.

## 2015-05-25 NOTE — Telephone Encounter (Signed)
Patient states he took a urinalysis the last time he picked up and would like to know why he has to take another one.

## 2015-06-27 ENCOUNTER — Telehealth: Payer: Self-pay | Admitting: Family

## 2015-06-27 MED ORDER — AMPHETAMINE-DEXTROAMPHET ER 20 MG PO CP24: 20.0000 mg | ORAL_CAPSULE | ORAL | Status: DC

## 2015-06-27 NOTE — Addendum Note (Signed)
Addended by: Kelle Darting A on: 06/27/2015 03:22 PM   Modules accepted: Orders

## 2015-06-27 NOTE — Telephone Encounter (Signed)
Rx placed at front desk and pt was notified.

## 2015-06-27 NOTE — Telephone Encounter (Signed)
Last Adderall Rx printed 05/25/15.  Last UDS 05/25/15.  Rx printed and forwarded to PCP for signature.

## 2015-06-27 NOTE — Telephone Encounter (Signed)
Caller name: Ash  Relationship to patient: Self  Can be reached: [phone]  Reason for call: Pt is requesting a refill on his ADDERALL XR Rx.

## 2015-07-25 ENCOUNTER — Telehealth: Payer: Self-pay | Admitting: Family

## 2015-07-25 MED ORDER — AMPHETAMINE-DEXTROAMPHET ER 20 MG PO CP24: 20.0000 mg | ORAL_CAPSULE | ORAL | Status: DC

## 2015-07-25 NOTE — Telephone Encounter (Signed)
Patient scheduled for 09/23/2015 for 6 month follow up

## 2015-07-25 NOTE — Telephone Encounter (Signed)
Rx placed at front desk and pt notified. 

## 2015-07-25 NOTE — Telephone Encounter (Signed)
Pt last seen 04/2015 and will need 6 month follow up. Last rx printed 06/27/15. CSC and UDS up to date. Rx printed and forwarded to PCP for signature.

## 2015-07-25 NOTE — Telephone Encounter (Signed)
Relation to [id number] Call back number:[phone] Pharmacy:  Reason for call:  Patient requesting a refiil amphetamine-dextroamphetamine (ADDERALL XR) 20 MG 24 hr capsule

## 2015-08-24 ENCOUNTER — Telehealth: Payer: Self-pay | Admitting: Family

## 2015-08-24 MED ORDER — AMPHETAMINE-DEXTROAMPHET ER 20 MG PO CP24: 20.0000 mg | ORAL_CAPSULE | ORAL | Status: DC

## 2015-08-24 MED FILL — DEXTROAMP-AMPHET ER 20 MG C: 20 | 30 days supply | Qty: 30 | Fill #0

## 2015-08-24 MED FILL — LEVOTHYROXINE 175 MCG TAB: 175 | 30 days supply | Qty: 30 | Fill #10

## 2015-08-24 NOTE — Telephone Encounter (Signed)
Last adderall RX 07/25/15, last UDS ? 05/2015 and pt has f/u 09/2014.  Rx printed and forwarded to PCP for signature.

## 2015-08-24 NOTE — Telephone Encounter (Signed)
Relation to [id number] Call back number:[phone]   Reason for call:  Patient requesting a refill amphetamine-dextroamphetamine (ADDERALL XR) 20 MG 24 hr capsule

## 2015-08-24 NOTE — Telephone Encounter (Signed)
Rx placed at front desk for pick up and left detailed message on pt's cell#.

## 2015-09-09 MED FILL — CIALIS 10 MG TABLET: 10 | 30 days supply | Qty: 8 | Fill #4

## 2015-09-09 MED FILL — CLOMIPHENE CITRATE 50 MG TA: 50 | 30 days supply | Qty: 15 | Fill #0

## 2015-09-22 ENCOUNTER — Encounter: Payer: Self-pay | Admitting: Family

## 2015-09-23 ENCOUNTER — Ambulatory Visit (INDEPENDENT_AMBULATORY_CARE_PROVIDER_SITE_OTHER): Payer: BLUE CROSS/BLUE SHIELD | Admitting: Family

## 2015-09-23 ENCOUNTER — Encounter: Payer: Self-pay | Admitting: Family

## 2015-09-23 VITALS — BP 134/82 | HR 66 | Temp 98.7°F | Resp 16 | Ht 69.5 in | Wt 197.0 lb

## 2015-09-23 DIAGNOSIS — F909 Attention-deficit hyperactivity disorder, unspecified type: Secondary | ICD-10-CM

## 2015-09-23 DIAGNOSIS — E039 Hypothyroidism, unspecified: Secondary | ICD-10-CM | POA: Diagnosis not present

## 2015-09-23 DIAGNOSIS — F988 Other specified behavioral and emotional disorders with onset usually occurring in childhood and adolescence: Secondary | ICD-10-CM

## 2015-09-23 MED ORDER — AMPHETAMINE-DEXTROAMPHET ER 30 MG PO CP24: 30.0000 mg | ORAL_CAPSULE | ORAL | Status: DC

## 2015-09-23 MED FILL — LEVOTHYROXINE 175 MCG TAB: 175 | 30 days supply | Qty: 30 | Fill #11

## 2015-09-23 MED FILL — DEXTROAMP-AMPHET ER 30 MG C: 30 | 30 days supply | Qty: 30 | Fill #0

## 2015-09-23 NOTE — Progress Notes (Signed)
Pre visit review using our clinic review tool, if applicable. No additional management support is needed unless otherwise documented below in the visit note. 

## 2015-09-23 NOTE — Assessment & Plan Note (Signed)
Uncontrolled. Will try increasing adderall xr to 30mg  once daily

## 2015-09-23 NOTE — Assessment & Plan Note (Signed)
Followed by Endo due to hx of thyroid CA.

## 2015-09-23 NOTE — Progress Notes (Signed)
Subjective:    Patient ID: Zachary [name], male    DOB: 12-Jul-1976, 40 y.o.   MRN: [id number]  HPI  Ms.  [name] is a 40 yr old male who presents today for follow up.  1) Hypothyroid- he follows with Dr. Posey [name] annually and has upcoming apt on 2/24. Feeling well on this dose. Lab Results  Component Value Date   TSH 1.455 02/01/2014   2) ADD- Pt reports that over the last 6 months around 2PM his concentration wanes.  This is new for him.    Review of Systems Se HPI  Past Medical History  Diagnosis Date  . History of papillary adenocarcinoma of thyroid     s/p thyroidectomy 06/2003  . Iatrogenic hyperthyroidism   . History of tobacco abuse   . ADD (attention deficit disorder)     Social History   Social History  . Marital Status: Married    Spouse Name: N/A  . Number of Children: N/A  . Years of Education: N/A   Occupational History  . Not on file.   Social History Main Topics  . Smoking status: Former Research scientist (life sciences)  . Smokeless tobacco: Not on file     Comment: Quit  about 7 years ago. 3-5 pack year history  . Alcohol Use: Yes     Comment: 2 drinks per week  . Drug Use: Not on file  . Sexual Activity: Not on file   Other Topics Concern  . 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  3 to 5 pack year history   Alcohol use-no          Past Surgical History  Procedure Laterality Date  . Thyroidectomy  06/2003  . Tonsillectomy  1985    Family History  Problem Relation Age of Onset  . Achalasia Father 70  . Coronary artery disease Paternal Grandmother   . Other Mother 37    healthy  . Hypertension Mother   . Stroke Maternal Grandfather   . Diabetes Neg Hx     No Known Allergies  Current Outpatient Prescriptions on File Prior to Visit  Medication Sig Dispense Refill  . CIALIS 5 MG tablet Take 2.5 mg by mouth daily.    .  clomiPHENE (CLOMID) 50 MG tablet Take 25 mg by mouth daily.    Marland Kitchen levothyroxine (SYNTHROID, LEVOTHROID) 175 MCG tablet Take 175 mcg by mouth daily before breakfast.     No current facility-administered medications on file prior to visit.    BP 134/82 mmHg  Pulse 66  Temp(Src) 98.7 F (37.1 C) (Oral)  Resp 16  Ht 5' 9.5" (1.765 m)  Wt 197 lb (89.359 kg)  BMI 28.68 kg/m2  SpO2 100%       Objective:   Physical Exam  Constitutional: He is oriented to person, place, and time. He appears well-developed and well-nourished. No distress.  HENT:  Head: Normocephalic and atraumatic.  Cardiovascular: Normal rate and regular rhythm.   No murmur heard. Pulmonary/Chest: Effort normal and breath sounds normal. No respiratory distress. He has no wheezes. He has no rales.  Musculoskeletal: He exhibits no edema.  Neurological: He is alert and oriented to person, place, and time.  Skin: Skin is warm and dry.  Psychiatric: He has a normal mood and affect. His behavior  is normal. Thought content normal.          Assessment & Plan:

## 2015-09-23 NOTE — Patient Instructions (Signed)
Increase adderall xr to 30mg  once daily Keep your upcoming appointment with Dr. Posey Pronto for your thyroid.

## 2015-10-24 ENCOUNTER — Ambulatory Visit (INDEPENDENT_AMBULATORY_CARE_PROVIDER_SITE_OTHER): Payer: BLUE CROSS/BLUE SHIELD | Admitting: Family

## 2015-10-24 ENCOUNTER — Encounter: Payer: Self-pay | Admitting: Family

## 2015-10-24 VITALS — BP 125/83 | HR 63 | Temp 98.6°F | Resp 16 | Ht 70.0 in | Wt 194.2 lb

## 2015-10-24 DIAGNOSIS — F909 Attention-deficit hyperactivity disorder, unspecified type: Secondary | ICD-10-CM | POA: Diagnosis not present

## 2015-10-24 DIAGNOSIS — F988 Other specified behavioral and emotional disorders with onset usually occurring in childhood and adolescence: Secondary | ICD-10-CM

## 2015-10-24 MED ORDER — AMPHETAMINE-DEXTROAMPHET ER 30 MG PO CP24: 30.0000 mg | ORAL_CAPSULE | ORAL | Status: DC

## 2015-10-24 MED FILL — LEVOTHYROXINE 175 MCG TAB: 175 | 34 days supply | Qty: 34 | Fill #0

## 2015-10-24 MED FILL — CIALIS 10 MG TABLET: 10 | 30 days supply | Qty: 8 | Fill #5

## 2015-10-24 MED FILL — CLOMIPHENE CITRATE 50 MG TA: 50 | 30 days supply | Qty: 15 | Fill #1

## 2015-10-24 MED FILL — DEXTROAMP-AMPHET ER 30 MG C: 30 | 30 days supply | Qty: 30 | Fill #0

## 2015-10-24 NOTE — Assessment & Plan Note (Signed)
Currently stable/improved on adderall xr. Continue current dose.

## 2015-10-24 NOTE — Progress Notes (Signed)
Subjective:    Patient ID: Zachary [name], male    DOB: [birth year], 40 y.o.   MRN: [id number]  HPI  Zachary [name] is a 40 yr old male who presents today for follow up of his ADHD.  Last visit he noted that his concentration was waning in the afternoon. Adderall Xr was increased to 30mg  once daily. Reports that is helping.  Review of Systems     Past Medical History  Diagnosis Date  . History of papillary adenocarcinoma of thyroid     s/p thyroidectomy 06/2003  . Iatrogenic hyperthyroidism   . History of tobacco abuse   . ADD (attention deficit disorder)     Social History   Social History  . Marital Status: Married    Spouse Name: N/A  . Number of Children: N/A  . Years of Education: N/A   Occupational History  . Not on file.   Social History Main Topics  . Smoking status: Former Research scientist (life sciences)  . Smokeless tobacco: Not on file     Comment: Quit  about 7 years ago. 3-5 pack year history  . Alcohol Use: Yes     Comment: 2 drinks per week  . Drug Use: Not on file  . Sexual Activity: Not on file   Other Topics Concern  . 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  3 to 5 pack year history   Alcohol use-no          Past Surgical History  Procedure Laterality Date  . Thyroidectomy  06/2003  . Tonsillectomy  1985    Family History  Problem Relation Age of Onset  . Achalasia Father 7  . Coronary artery disease Paternal Grandmother   . Other Mother 38    healthy  . Hypertension Mother   . Stroke Maternal Grandfather   . Diabetes Neg Hx     No Known Allergies  Current Outpatient Prescriptions on File Prior to Visit  Medication Sig Dispense Refill  . amphetamine-dextroamphetamine (ADDERALL XR) 30 MG 24 hr capsule Take 1 capsule (30 mg total) by mouth every morning. 30 capsule 0  . CIALIS 5 MG tablet Take 2.5 mg by mouth daily.      . clomiPHENE (CLOMID) 50 MG tablet Take 25 mg by mouth daily.    Marland Kitchen levothyroxine (SYNTHROID, LEVOTHROID) 175 MCG tablet Take 175 mcg by mouth daily before breakfast.     No current facility-administered medications on file prior to visit.    BP 125/83 mmHg  Pulse 63  Temp(Src) 98.6 F (37 C) (Oral)  Resp 16  Ht 5\' 10"  (1.778 m)  Wt 194 lb 3.2 oz (88.089 kg)  BMI 27.86 kg/m2  SpO2 100%    Objective:   Physical Exam  Constitutional: He is oriented to person, place, and time. He appears well-developed and well-nourished. No distress.  HENT:  Head: Normocephalic and atraumatic.  Cardiovascular: Normal rate and regular rhythm.   No murmur heard. Pulmonary/Chest: Effort normal and breath sounds normal. No respiratory distress. He has no wheezes. He has no rales.  Musculoskeletal: He exhibits no edema.  Neurological: He is alert and oriented to person, place, and time.  Skin: Skin is warm and dry.  Psychiatric: He has a normal mood and affect. His behavior is normal. Thought content normal.  Assessment & Plan:

## 2015-10-24 NOTE — Progress Notes (Signed)
Pre visit review using our clinic review tool, if applicable. No additional management support is needed unless otherwise documented below in the visit note. 

## 2015-10-24 NOTE — Patient Instructions (Signed)
Please go to the lab.  (UDS)

## 2015-11-08 ENCOUNTER — Encounter: Payer: Self-pay | Admitting: Family

## 2015-11-25 ENCOUNTER — Telehealth: Payer: Self-pay | Admitting: Family

## 2015-11-25 MED FILL — LEVOTHYROXINE 175 MCG TAB: 175 | 34 days supply | Qty: 34 | Fill #1

## 2015-11-25 MED FILL — CLOMIPHENE CITRATE 50 MG TA: 50 | 30 days supply | Qty: 15 | Fill #2

## 2015-11-25 MED FILL — CIALIS 10 MG TABLET: 10 | 30 days supply | Qty: 8 | Fill #6

## 2015-11-28 MED ORDER — AMPHETAMINE-DEXTROAMPHET ER 30 MG PO CP24: 30.0000 mg | ORAL_CAPSULE | ORAL | Status: DC

## 2015-11-28 MED FILL — DEXTROAMP-AMPHET ER 30 MG C: 30 | 30 days supply | Qty: 30 | Fill #0

## 2015-11-28 NOTE — Telephone Encounter (Signed)
Last adderall Rx:  10/24/15, #30 Last UDS:  09/2015, next due 12/2015 Last OV:  10/24/15  Rx printed and forwarded to PCP for signature.

## 2015-11-28 NOTE — Telephone Encounter (Signed)
Rx signed and placed at the front desk for pt to pick up. Message sent.

## 2015-12-26 ENCOUNTER — Other Ambulatory Visit: Payer: Self-pay | Admitting: Family

## 2015-12-26 MED ORDER — AMPHETAMINE-DEXTROAMPHET ER 30 MG PO CP24: 30.0000 mg | ORAL_CAPSULE | ORAL | Status: DC

## 2015-12-26 NOTE — Telephone Encounter (Signed)
Last Adderall XR:  11/28/15, #30 NEXT OV: 04/23/16 UDS: moderate, last 10/2015.  Rx printed and forwarded to PCP for signature.

## 2015-12-28 MED FILL — DEXTROAMP-AMPHET ER 30 MG C: 30 | 30 days supply | Qty: 30 | Fill #0

## 2015-12-28 MED FILL — CLOMIPHENE CITRATE 50 MG TA: 50 | 30 days supply | Qty: 15 | Fill #3

## 2015-12-28 MED FILL — LEVOTHYROXINE 175 MCG TAB: 175 | 34 days supply | Qty: 34 | Fill #2

## 2016-01-20 MED FILL — CIALIS 10 MG TABLET: 10 | 30 days supply | Qty: 8 | Fill #7

## 2016-01-20 MED FILL — CLOMIPHENE CITRATE 50 MG TA: 50 | 30 days supply | Qty: 15 | Fill #4

## 2016-01-27 ENCOUNTER — Other Ambulatory Visit: Payer: Self-pay | Admitting: Family

## 2016-01-27 ENCOUNTER — Other Ambulatory Visit: Payer: Self-pay | Admitting: Emergency Medicine

## 2016-01-27 NOTE — Telephone Encounter (Signed)
Received refill request from pt for ADDERALL XR.   Last OV: 10/24/2015 Last Refill: 12/26/2015  Is it ok to refill? Please advise. Thanks

## 2016-01-30 MED ORDER — AMPHETAMINE-DEXTROAMPHET ER 30 MG PO CP24: 30.0000 mg | ORAL_CAPSULE | ORAL | Status: DC

## 2016-01-30 MED FILL — DEXTROAMP-AMPHET ER 30 MG C: 30 | 30 days supply | Qty: 30 | Fill #0

## 2016-01-30 MED FILL — LEVOTHYROXINE 175 MCG TAB: 175 | 34 days supply | Qty: 34 | Fill #3

## 2016-01-30 NOTE — Telephone Encounter (Signed)
See rx. 

## 2016-01-30 NOTE — Telephone Encounter (Signed)
Rx placed at front desk for pick up and left detailed message on pt's cell#

## 2016-02-20 MED FILL — CLOMIPHENE CITRATE 50 MG TA: 50 | 30 days supply | Qty: 15 | Fill #5

## 2016-02-20 MED FILL — CIALIS 10 MG TABLET: 10 | 30 days supply | Qty: 8 | Fill #8

## 2016-02-24 MED FILL — LEVOTHYROXINE 175 MCG TAB: 175 | 34 days supply | Qty: 34 | Fill #4

## 2016-02-27 ENCOUNTER — Other Ambulatory Visit: Payer: Self-pay | Admitting: Family

## 2016-02-27 MED ORDER — AMPHETAMINE-DEXTROAMPHET ER 30 MG PO CP24: 30.0000 mg | ORAL_CAPSULE | ORAL | Status: DC

## 2016-02-27 NOTE — Telephone Encounter (Signed)
Your prescription is ready for pick up at the front desk.

## 2016-02-27 NOTE — Telephone Encounter (Signed)
Sent mychart message to pt. 

## 2016-02-27 NOTE — Telephone Encounter (Signed)
Rx printed and fowarded to PCP for signature.

## 2016-02-27 NOTE — Addendum Note (Signed)
Addended by: Kelle Darting A on: 02/27/2016 06:08 PM   Modules accepted: Orders

## 2016-02-27 NOTE — Telephone Encounter (Signed)
Requesting Adderall XR 30mg -Take 1 capsule by mouth every morning. Last refill:01/30/16;#30,0 Last OV:10/24/15  UDS:10/24/15 Please advise.//AB/CMA

## 2016-02-28 MED FILL — DEXTROAMP-AMPHET ER 30 MG C: 30 | 30 days supply | Qty: 30 | Fill #0

## 2016-03-26 ENCOUNTER — Other Ambulatory Visit: Payer: Self-pay | Admitting: Family

## 2016-03-26 MED ORDER — AMPHETAMINE-DEXTROAMPHET ER 30 MG PO CP24
30.0000 mg | ORAL_CAPSULE | ORAL | 0 refills | Status: DC
Start: 2016-03-26 — End: 2016-04-24

## 2016-03-26 MED FILL — LEVOTHYROXINE 175 MCG TAB: 175 | 34 days supply | Qty: 34 | Fill #5

## 2016-03-26 NOTE — Telephone Encounter (Signed)
Last Adderall RX: 02/27/16, #30 UDS: 10/24/15 Last OV: 10/24/15 Next OV: cpe due 04/25/16  Rx printed and forwarded to PCP for signature.

## 2016-03-29 MED FILL — DEXTROAMP-AMPHET ER 30 MG C: 30 | 30 days supply | Qty: 30 | Fill #0

## 2016-04-03 MED FILL — CIALIS 5 MG TABLET: 5 | 8 days supply | Qty: 8 | Fill #0

## 2016-04-03 MED FILL — CLOMIPHENE CITRATE 50 MG TA: 50 | 30 days supply | Qty: 15 | Fill #0

## 2016-04-23 ENCOUNTER — Encounter: Payer: Self-pay | Admitting: Family

## 2016-04-23 ENCOUNTER — Ambulatory Visit (INDEPENDENT_AMBULATORY_CARE_PROVIDER_SITE_OTHER): Payer: BLUE CROSS/BLUE SHIELD | Admitting: Family

## 2016-04-23 VITALS — BP 143/86 | HR 78 | Temp 98.7°F | Resp 16 | Ht 69.5 in | Wt 191.6 lb

## 2016-04-23 DIAGNOSIS — Z0000 Encounter for general adult medical examination without abnormal findings: Secondary | ICD-10-CM

## 2016-04-23 DIAGNOSIS — F909 Attention-deficit hyperactivity disorder, unspecified type: Secondary | ICD-10-CM | POA: Diagnosis not present

## 2016-04-23 DIAGNOSIS — F988 Other specified behavioral and emotional disorders with onset usually occurring in childhood and adolescence: Secondary | ICD-10-CM

## 2016-04-23 DIAGNOSIS — E039 Hypothyroidism, unspecified: Secondary | ICD-10-CM

## 2016-04-23 DIAGNOSIS — Z23 Encounter for immunization: Secondary | ICD-10-CM

## 2016-04-23 LAB — HEPATIC FUNCTION PANEL
ALK PHOS: 60 U/L (ref 39–117)
ALT: 19 U/L (ref 0–53)
AST: 25 U/L (ref 0–37)
Albumin: 4.4 g/dL (ref 3.5–5.2)
BILIRUBIN DIRECT: 0.2 mg/dL (ref 0.0–0.3)
BILIRUBIN TOTAL: 0.8 mg/dL (ref 0.2–1.2)
Total Protein: 6.8 g/dL (ref 6.0–8.3)

## 2016-04-23 LAB — LIPID PANEL
CHOL/HDL RATIO: 4
Cholesterol: 164 mg/dL (ref 0–200)
HDL: 39.7 mg/dL (ref >39.00)
LDL CALC: 106 mg/dL — AB (ref 0–99)
NonHDL: 124.21
TRIGLYCERIDES: 93 mg/dL (ref 0.0–149.0)
VLDL: 18.6 mg/dL (ref 0.0–40.0)

## 2016-04-23 LAB — CBC WITH DIFFERENTIAL/PLATELET
BASOS ABS: 0 10*3/uL (ref 0.0–0.1)
BASOS PCT: 0.6 % (ref 0.0–3.0)
EOS ABS: 0.2 10*3/uL (ref 0.0–0.7)
EOS PCT: 2.2 % (ref 0.0–5.0)
HCT: 44.3 % (ref 39.0–52.0)
Hemoglobin: 15.3 g/dL (ref 13.0–17.0)
LYMPHS PCT: 24.8 % (ref 12.0–46.0)
Lymphs Abs: 1.9 10*3/uL (ref 0.7–4.0)
MCHC: 34.6 g/dL (ref 30.0–36.0)
MCV: 85.4 fl (ref 78.0–100.0)
MONO ABS: 0.6 10*3/uL (ref 0.1–1.0)
MONOS PCT: 7.3 % (ref 3.0–12.0)
Neutro Abs: 5 10*3/uL (ref 1.4–7.7)
Neutrophils Relative %: 65.1 % (ref 43.0–77.0)
Platelets: 279 10*3/uL (ref 150.0–400.0)
RBC: 5.19 Mil/uL (ref 4.22–5.81)
RDW: 13.4 % (ref 11.5–15.5)
WBC: 7.7 10*3/uL (ref 4.0–10.5)

## 2016-04-23 LAB — BASIC METABOLIC PANEL
BUN: 13 mg/dL (ref 6–23)
CO2: 29 mEq/L (ref 19–32)
Calcium: 9.1 mg/dL (ref 8.4–10.5)
Chloride: 104 mEq/L (ref 96–112)
Creatinine, Ser: 0.96 mg/dL (ref 0.40–1.50)
GFR: 92.15 mL/min (ref >60.00)
Glucose, Bld: 101 mg/dL — ABNORMAL HIGH (ref 70–99)
POTASSIUM: 4 meq/L (ref 3.5–5.1)
SODIUM: 138 meq/L (ref 135–145)

## 2016-04-23 LAB — URINALYSIS, ROUTINE W REFLEX MICROSCOPIC
Bilirubin Urine: NEGATIVE
Hgb urine dipstick: NEGATIVE
Ketones, ur: NEGATIVE
Leukocytes, UA: NEGATIVE
Nitrite: NEGATIVE
PH: 7 (ref 5.0–8.0)
RBC / HPF: NONE SEEN (ref 0–?)
Specific Gravity, Urine: <=1.005 — AB (ref 1.000–1.030)
TOTAL PROTEIN, URINE-UPE24: NEGATIVE
UROBILINOGEN UA: 0.2 (ref 0.0–1.0)
Urine Glucose: NEGATIVE
WBC, UA: NONE SEEN (ref 0–?)

## 2016-04-23 NOTE — Addendum Note (Signed)
Addended by: Kelle Darting A on: 04/23/2016 10:35 AM   Modules accepted: Orders

## 2016-04-23 NOTE — Assessment & Plan Note (Signed)
Managed by endo due to hx of thyroid cancer.

## 2016-04-23 NOTE — Assessment & Plan Note (Signed)
Stable on current dose of adderall, continue same.

## 2016-04-23 NOTE — Patient Instructions (Signed)
Continue healthy diet, exercise.  Complete lab work prior to leaving.

## 2016-04-23 NOTE — Assessment & Plan Note (Signed)
Continue healthy diet and exercise. Tdap and flu shot today. Obtain routine lab work.

## 2016-04-23 NOTE — Progress Notes (Signed)
Pre visit review using our clinic review tool, if applicable. No additional management support is needed unless otherwise documented below in the visit note. 

## 2016-04-23 NOTE — Progress Notes (Signed)
Subjective:    Patient ID: Zachary [name], male    DOB: 12-01-1975, 40 y.o.   MRN: [id number]  HPI  Mr. Zachary [name] is a 40 yr old male who presents today for cpx.  Patient presents today for complete physical.  Immunizations: flu shot today, tdap due Diet: reports that his diet is fair Exercise: coaching softball, playing soccer Dental: up to date Vision:  Up to date  ADD-reports adderall continues to help with is concentration.  Wt Readings from Last 3 Encounters:  04/23/16 191 lb 9.6 oz (86.9 kg)  10/24/15 194 lb 3.2 oz (88.1 kg)  09/23/15 197 lb (89.4 kg)     Review of Systems  Constitutional: Negative for unexpected weight change.  HENT: Negative for hearing loss and rhinorrhea.   Eyes: Negative for visual disturbance.  Respiratory: Negative for cough and shortness of breath.   Cardiovascular: Negative for chest pain and leg swelling.  Gastrointestinal: Negative for blood in stool, constipation and diarrhea.  Genitourinary: Negative for dysuria and frequency.  Musculoskeletal: Negative for arthralgias and myalgias.  Skin: Negative for rash.  Neurological: Negative for headaches.  Hematological: Negative for adenopathy.  Psychiatric/Behavioral:       Denies depression/anxiety       Past Medical History:  Diagnosis Date  . ADD (attention deficit disorder)   . History of papillary adenocarcinoma of thyroid    s/p thyroidectomy 06/2003  . History of tobacco abuse   . Iatrogenic hyperthyroidism      Social History   Social History  . Marital status: Married    Spouse name: N/A  . Number of children: N/A  . Years of education: N/A   Occupational History  . Not on file.   Social History Main Topics  . Smoking status: Former Research scientist (life sciences)  . Smokeless tobacco: Never Used     Comment: Quit  about 7 years ago. 3-5 pack year history  . Alcohol use Yes     Comment: 2-3 drinks per week  . Drug use: No  . Sexual activity: Not on file   Other Topics Concern  . 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  3 to 5 pack year history   Alcohol use-no          Past Surgical History:  Procedure Laterality Date  . THYROIDECTOMY  06/2003  . TONSILLECTOMY  1985    Family History  Problem Relation Age of Onset  . Achalasia Father 70  . Other Mother 54    healthy  . Hypertension Mother   . Coronary artery disease Paternal Grandmother   . Stroke Maternal Grandfather   . Diabetes Neg Hx     No Known Allergies  Current Outpatient Prescriptions on File Prior to Visit  Medication Sig Dispense Refill  . amphetamine-dextroamphetamine (ADDERALL XR) 30 MG 24 hr capsule Take 1 capsule (30 mg total) by mouth every morning. 30 capsule 0  . CIALIS 5 MG tablet Take 2.5 mg by mouth daily.    . clomiPHENE (CLOMID) 50 MG tablet Take 25 mg by mouth daily.    Marland Kitchen levothyroxine (SYNTHROID, LEVOTHROID) 175 MCG tablet Take 175 mcg by mouth daily before breakfast.     No current facility-administered medications on file prior to visit.     BP (!) 143/86 (BP  Location: Left Arm, Cuff Size: Normal)   Pulse 78   Temp 98.7 F (37.1 C) (Oral)   Resp 16   Ht 5' 9.5" (1.765 m)   Wt 191 lb 9.6 oz (86.9 kg)   SpO2 99% Comment: room air  BMI 27.89 kg/m    Objective:   Physical Exam Physical Exam  Constitutional: He is oriented to person, place, and time. He appears well-developed and well-nourished. No distress.  HENT:  Head: Normocephalic and atraumatic.  Right Ear: Tympanic membrane and ear canal normal.  Left Ear: Tympanic membrane and ear canal normal.  Mouth/Throat: Oropharynx is clear and moist.  Eyes: Pupils are equal, round, and reactive to light. No scleral icterus.  Neck: Normal range of motion. No thyromegaly present.  Cardiovascular: Normal rate and regular rhythm.   No murmur heard. Pulmonary/Chest: Effort normal  and breath sounds normal. No respiratory distress. He has no wheezes. He has no rales. He exhibits no tenderness.  Abdominal: Soft. Bowel sounds are normal. He exhibits no distension and no mass. There is no tenderness. There is no rebound and no guarding.  Musculoskeletal: He exhibits no edema.  Lymphadenopathy:    He has no cervical adenopathy.  Neurological: He is alert and oriented to person, place, and time. He has normal patellar reflexes. He exhibits normal muscle tone. Coordination normal.  Skin: Skin is warm and dry.  Psychiatric: He has a normal mood and affect. His behavior is normal. Judgment and thought content normal.          Assessment & Plan:   tdap and flu shot today. Obtain UDS per controlled substance contract due to adderall use.        Assessment & Plan:

## 2016-04-24 ENCOUNTER — Other Ambulatory Visit: Payer: Self-pay | Admitting: Family

## 2016-04-24 MED ORDER — AMPHETAMINE-DEXTROAMPHET ER 30 MG PO CP24: 30.0000 mg | ORAL_CAPSULE | ORAL | 0 refills | Status: DC

## 2016-04-24 NOTE — Telephone Encounter (Signed)
Last adderall RX: 03/26/16 Last OV: 04/23/16 UDS:  Moderate 01/2016 and UDS was collected at OV on 04/23/16  Rx printed and forwarded to PCP for signature.

## 2016-04-26 MED FILL — DEXTROAMP-AMPHET ER 30 MG C: 30 | 30 days supply | Qty: 30 | Fill #0

## 2016-05-08 MED FILL — LEVOTHYROXINE 175 MCG TAB: 175 | 34 days supply | Qty: 34 | Fill #6

## 2016-05-08 MED FILL — CLOMIPHENE CITRATE 50 MG TA: 50 | 30 days supply | Qty: 15 | Fill #1

## 2016-05-23 ENCOUNTER — Other Ambulatory Visit: Payer: Self-pay | Admitting: Family

## 2016-05-23 MED FILL — PAZEO 0.7% EYE DROPS: 0.7 | 30 days supply | Qty: 3 | Fill #0

## 2016-05-24 ENCOUNTER — Encounter: Payer: Self-pay | Admitting: Family

## 2016-05-25 MED ORDER — AMPHETAMINE-DEXTROAMPHET ER 30 MG PO CP24: 30.0000 mg | ORAL_CAPSULE | ORAL | 0 refills | Status: DC

## 2016-05-25 NOTE — Telephone Encounter (Signed)
Last Adderall RF:  04/24/16, #30 Last OV: 04/23/16 UDS: moderated due 07/2016  Rx printed and forwarded to PCP for signature.

## 2016-05-28 MED FILL — DEXTROAMP-AMPHET ER 30 MG C: 30 | 30 days supply | Qty: 30 | Fill #0

## 2016-06-01 MED FILL — CIALIS 5 MG TABLET: 5 | 8 days supply | Qty: 8 | Fill #1

## 2016-06-18 MED FILL — LEVOTHYROXINE 175 MCG TAB: 175 | 34 days supply | Qty: 34 | Fill #7

## 2016-06-18 MED FILL — CLOMIPHENE CITRATE 50 MG TA: 50 | 30 days supply | Qty: 15 | Fill #2

## 2016-06-27 ENCOUNTER — Other Ambulatory Visit: Payer: Self-pay | Admitting: Family

## 2016-06-27 MED ORDER — AMPHETAMINE-DEXTROAMPHET ER 30 MG PO CP24
30.0000 mg | ORAL_CAPSULE | ORAL | 0 refills | Status: DC
Start: 2016-06-27 — End: 2016-07-25

## 2016-06-27 MED FILL — DEXTROAMP-AMPHET ER 30 MG C: 30 | 30 days supply | Qty: 30 | Fill #0

## 2016-06-27 NOTE — Telephone Encounter (Signed)
Last adderall RX: 05/25/16, #30 Last OV: 04/23/16 Next OV: 10/2016 UDS: moderate, 04/23/16  Rx printed and forwarded to PCP for signature.

## 2016-07-13 MED FILL — CLOMIPHENE CITRATE 50 MG TA: 50 | 30 days supply | Qty: 15 | Fill #3

## 2016-07-13 MED FILL — CIALIS 5 MG TABLET: 5 | 8 days supply | Qty: 8 | Fill #2

## 2016-07-23 MED FILL — LEVOTHYROXINE 175 MCG TAB: 175 | 34 days supply | Qty: 34 | Fill #8

## 2016-07-25 ENCOUNTER — Other Ambulatory Visit: Payer: Self-pay | Admitting: Family

## 2016-07-25 MED ORDER — AMPHETAMINE-DEXTROAMPHET ER 30 MG PO CP24
30.0000 mg | ORAL_CAPSULE | ORAL | 0 refills | Status: DC
Start: 2016-07-25 — End: 2016-08-24

## 2016-07-25 NOTE — Telephone Encounter (Signed)
Last Rx: 06/27/16, #30 UDS: 04/23/16 and due now Next OV: 10/22/16.  Rx printed and forwarded to PCP for signature.

## 2016-07-25 NOTE — Telephone Encounter (Signed)
Rx placed at front desk for pick and message sent to pt.

## 2016-07-27 MED FILL — DEXTROAMP-AMPHET ER 30 MG C: 30 | 30 days supply | Qty: 30 | Fill #0

## 2016-07-31 ENCOUNTER — Encounter: Payer: Self-pay | Admitting: Family

## 2016-07-31 DIAGNOSIS — Z79899 Other long term (current) drug therapy: Secondary | ICD-10-CM | POA: Diagnosis not present

## 2016-08-14 ENCOUNTER — Ambulatory Visit (HOSPITAL_BASED_OUTPATIENT_CLINIC_OR_DEPARTMENT_OTHER)
Admission: RE | Admit: 2016-08-14 | Discharge: 2016-08-14 | Disposition: A | Discharge status: Home / Self Care | Payer: BLUE CROSS/BLUE SHIELD | Source: Ambulatory Visit | Attending: Family | Admitting: Family

## 2016-08-14 ENCOUNTER — Encounter: Payer: Self-pay | Admitting: Family

## 2016-08-14 ENCOUNTER — Ambulatory Visit (INDEPENDENT_AMBULATORY_CARE_PROVIDER_SITE_OTHER): Payer: BLUE CROSS/BLUE SHIELD | Admitting: Family

## 2016-08-14 ENCOUNTER — Encounter (HOSPITAL_BASED_OUTPATIENT_CLINIC_OR_DEPARTMENT_OTHER): Payer: Self-pay

## 2016-08-14 VITALS — BP 134/89 | HR 61 | Temp 98.8°F | Resp 16 | Ht 70.0 in | Wt 204.4 lb

## 2016-08-14 DIAGNOSIS — R229 Localized swelling, mass and lump, unspecified: Principal | ICD-10-CM

## 2016-08-14 DIAGNOSIS — M545 Low back pain, unspecified: Secondary | ICD-10-CM

## 2016-08-14 DIAGNOSIS — IMO0002 Reserved for concepts with insufficient information to code with codable children: Secondary | ICD-10-CM

## 2016-08-14 DIAGNOSIS — R1907 Generalized intra-abdominal and pelvic swelling, mass and lump: Secondary | ICD-10-CM | POA: Diagnosis not present

## 2016-08-14 MED FILL — CIALIS 5 MG TABLET: 5 | 8 days supply | Qty: 8 | Fill #3

## 2016-08-14 MED FILL — CLOMIPHENE CITRATE 50 MG TA: 50 | 30 days supply | Qty: 15 | Fill #4

## 2016-08-14 MED FILL — CIALIS 5 MG TABLET: 5 | 7 days supply | Qty: 7 | Fill #4

## 2016-08-14 NOTE — Progress Notes (Signed)
Pre visit review using our clinic review tool, if applicable. No additional management support is needed unless otherwise documented below in the visit note. 

## 2016-08-14 NOTE — Patient Instructions (Addendum)
Return at 1:30 for Ultrasound in the imaging department. For your back pain, try the exercises below twice daily for the next few weeks.  Call if symptoms worsen or if they do not not improve.    Back Exercises The following exercises strengthen the muscles that help to support the back. They also help to keep the lower back flexible. Doing these exercises can help to prevent back pain or lessen existing pain. If you have back pain or discomfort, try doing these exercises 2-3 times each day or as told by your health care provider. When the pain goes away, do them once each day, but increase the number of times that you repeat the steps for each exercise (do more repetitions). If you do not have back pain or discomfort, do these exercises once each day or as told by your health care provider. Exercises Single Knee to Chest  Repeat these steps 3-5 times for each leg: 1. Lie on your back on a firm bed or the floor with your legs extended. 2. Bring one knee to your chest. Your other leg should stay extended and in contact with the floor. 3. Hold your knee in place by grabbing your knee or thigh. 4. Pull on your knee until you feel a gentle stretch in your lower back. 5. Hold the stretch for 10-30 seconds. 6. Slowly release and straighten your leg. Pelvic Tilt  Repeat these steps 5-10 times: 1. Lie on your back on a firm bed or the floor with your legs extended. 2. Bend your knees so they are pointing toward the ceiling and your feet are flat on the floor. 3. Tighten your lower abdominal muscles to press your lower back against the floor. This motion will tilt your pelvis so your tailbone points up toward the ceiling instead of pointing to your feet or the floor. 4. With gentle tension and even breathing, hold this position for 5-10 seconds. Cat-Cow  Repeat these steps until your lower back becomes more flexible: 1. Get into a hands-and-knees position on a firm surface. Keep your hands under your  shoulders, and keep your knees under your hips. You may place padding under your knees for comfort. 2. Let your head hang down, and point your tailbone toward the floor so your lower back becomes rounded like the back of a cat. 3. Hold this position for 5 seconds. 4. Slowly lift your head and point your tailbone up toward the ceiling so your back forms a sagging arch like the back of a cow. 5. Hold this position for 5 seconds. Press-Ups  Repeat these steps 5-10 times: 1. Lie on your abdomen (face-down) on the floor. 2. Place your palms near your head, about shoulder-width apart. 3. While you keep your back as relaxed as possible and keep your hips on the floor, slowly straighten your arms to raise the top half of your body and lift your shoulders. Do not use your back muscles to raise your upper torso. You may adjust the placement of your hands to make yourself more comfortable. 4. Hold this position for 5 seconds while you keep your back relaxed. 5. Slowly return to lying flat on the floor. Bridges  Repeat these steps 10 times: 1. Lie on your back on a firm surface. 2. Bend your knees so they are pointing toward the ceiling and your feet are flat on the floor. 3. Tighten your buttocks muscles and lift your buttocks off of the floor until your waist is at almost  the same height as your knees. You should feel the muscles working in your buttocks and the back of your thighs. If you do not feel these muscles, slide your feet 1-2 inches farther away from your buttocks. 4. Hold this position for 3-5 seconds. 5. Slowly lower your hips to the starting position, and allow your buttocks muscles to relax completely. If this exercise is too easy, try doing it with your arms crossed over your chest. Abdominal Crunches  Repeat these steps 5-10 times: 1. Lie on your back on a firm bed or the floor with your legs extended. 2. Bend your knees so they are pointing toward the ceiling and your feet are flat on  the floor. 3. Cross your arms over your chest. 4. Tip your chin slightly toward your chest without bending your neck. 5. Tighten your abdominal muscles and slowly raise your trunk (torso) high enough to lift your shoulder blades a tiny bit off of the floor. Avoid raising your torso higher than that, because it can put too much stress on your low back and it does not help to strengthen your abdominal muscles. 6. Slowly return to your starting position. Back Lifts  Repeat these steps 5-10 times: 1. Lie on your abdomen (face-down) with your arms at your sides, and rest your forehead on the floor. 2. Tighten the muscles in your legs and your buttocks. 3. Slowly lift your chest off of the floor while you keep your hips pressed to the floor. Keep the back of your head in line with the curve in your back. Your eyes should be looking at the floor. 4. Hold this position for 3-5 seconds. 5. Slowly return to your starting position. Contact a health care provider if:  Your back pain or discomfort gets much worse when you do an exercise.  Your back pain or discomfort does not lessen within 2 hours after you exercise. If you have any of these problems, stop doing these exercises right away. Do not do them again unless your health care provider says that you can. Get help right away if:  You develop sudden, severe back pain. If this happens, stop doing the exercises right away. Do not do them again unless your health care provider says that you can. This information is not intended to replace advice given to you by your health care provider. Make sure you discuss any questions you have with your health care provider. Document Released: 09/06/2004 Document Revised: 12/07/2015 Document Reviewed: 09/23/2014 Elsevier Interactive Patient Education  2017 [employer].

## 2016-08-14 NOTE — Addendum Note (Signed)
Addended by: Debbrah Alar on: 08/14/2016 01:20 PM   Modules accepted: Orders

## 2016-08-14 NOTE — Progress Notes (Signed)
Subjective:    Patient ID: Zachary [name], male    DOB: April 17, 1976, 41 y.o.   MRN: [id number]  HPI  Zachary [name] is a 41 yr old male who presents today with chief complaint of "knot" on his back. "Knot" has been present x 9 months.   He reports some occasional low back "fatigue".  Standing. Non-radiating.  Stretching helps.  Uses occasional advil with relief.   Review of Systems See HPI  Past Medical History:  Diagnosis Date  . ADD (attention deficit disorder)   . History of papillary adenocarcinoma of thyroid    s/p thyroidectomy 06/2003  . History of tobacco abuse   . Iatrogenic hyperthyroidism      Social History   Social History  . Marital status: Married    Spouse name: N/A  . Number of children: N/A  . Years of education: N/A   Occupational History  . Not on file.   Social History Main Topics  . Smoking status: Former Research scientist (life sciences)  . Smokeless tobacco: Never Used     Comment: Quit  about 7 years ago. 3-5 pack year history  . Alcohol use Yes     Comment: 2-3 drinks per week  . Drug use: No  . Sexual activity: Not on file   Other Topics Concern  . 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  3 to 5 pack year history   Alcohol use-no          Past Surgical History:  Procedure Laterality Date  . THYROIDECTOMY  06/2003  . TONSILLECTOMY  1985    Family History  Problem Relation Age of Onset  . Achalasia Father 37  . Other Mother 106    healthy  . Hypertension Mother   . Coronary artery disease Paternal Grandmother   . Stroke Maternal Grandfather   . Diabetes Neg Hx     No Known Allergies  Current Outpatient Prescriptions on File Prior to Visit  Medication Sig Dispense Refill  . amphetamine-dextroamphetamine (ADDERALL XR) 30 MG 24 hr capsule Take 1 capsule (30 mg total) by mouth every morning. 30 capsule 0  . CIALIS 5 MG tablet Take 2.5  mg by mouth daily.    . clomiPHENE (CLOMID) 50 MG tablet Take 25 mg by mouth daily.    Marland Kitchen levothyroxine (SYNTHROID, LEVOTHROID) 175 MCG tablet Take 175 mcg by mouth daily before breakfast.     No current facility-administered medications on file prior to visit.     BP 134/89 (BP Location: Left Arm, Cuff Size: Large)   Pulse 61   Temp 98.8 F (37.1 C) (Oral)   Resp 16   Ht 5\' 10"  (1.778 m)   Wt 204 lb 6.4 oz (92.7 kg)   SpO2 99% Comment: room air  BMI 29.33 kg/m       Objective:   Physical Exam  Constitutional: He is oriented to person, place, and time. He appears well-developed and well-nourished. No distress.  Cardiovascular: Normal rate and regular rhythm.   No murmur heard. Pulmonary/Chest: Effort normal and breath sounds normal. No respiratory distress. He has no wheezes. He has no rales. He exhibits no tenderness.  Musculoskeletal: He exhibits no edema.       Cervical back: He exhibits no tenderness.       Thoracic back: He exhibits no tenderness.  Lumbar back: He exhibits no tenderness.  Soft, mobile approx 1-2 inch diameter mobile mass left lower back, adjacent to left lumbar spine  Neurological: He is alert and oriented to person, place, and time.  Reflex Scores:      Patellar reflexes are 2+ on the right side and 2+ on the left side. Skin: Skin is warm and dry.  Psychiatric: He has a normal mood and affect. His behavior is normal. Judgment and thought content normal.          Assessment & Plan:  Low back pain- musculoskeletal in nature. Advised pt on stretching exercises as outlined in AVS and sparing use of NSAIDS. Advised pt to call if symptoms worsen or if symptoms fail to improve.   Mass- suspect lipoma. Will obtain ultrasound to further evaluate.

## 2016-08-16 ENCOUNTER — Encounter: Payer: Self-pay | Admitting: Family

## 2016-08-24 ENCOUNTER — Other Ambulatory Visit: Payer: Self-pay | Admitting: Family

## 2016-08-24 ENCOUNTER — Telehealth: Payer: Self-pay | Admitting: Family

## 2016-08-24 MED ORDER — AMPHETAMINE-DEXTROAMPHET ER 30 MG PO CP24: 30.0000 mg | ORAL_CAPSULE | ORAL | 0 refills | Status: DC

## 2016-08-24 MED FILL — LEVOTHYROXINE 175 MCG TAB: 175 | 34 days supply | Qty: 34 | Fill #9

## 2016-08-24 MED FILL — DEXTROAMP-AMPHET ER 30 MG C: 30 | 30 days supply | Qty: 30 | Fill #0

## 2016-08-24 NOTE — Telephone Encounter (Signed)
Last UDS was 07/27/16. Rx placed at front desk for pick up and message sent to pt.

## 2016-08-24 NOTE — Telephone Encounter (Signed)
See rx. 

## 2016-08-24 NOTE — Telephone Encounter (Signed)
Pt is requesting refill on Adderall XR 30 mg.  Last OV: 08/14/2016 Last Fill: 07/25/2016 #30 and 0RF UDS: 10/24/2015 Moderate risk  Please advise.

## 2016-08-24 NOTE — Telephone Encounter (Signed)
See 08/23/16 phone note. 

## 2016-09-19 MED FILL — CLOMIPHENE CITRATE 50 MG TA: 50 | 30 days supply | Qty: 15 | Fill #5

## 2016-09-19 MED FILL — CIALIS 5 MG TABLET: 5 | 8 days supply | Qty: 8 | Fill #5

## 2016-09-25 ENCOUNTER — Other Ambulatory Visit: Payer: Self-pay | Admitting: Family

## 2016-09-25 MED ORDER — AMPHETAMINE-DEXTROAMPHET ER 30 MG PO CP24: 30.0000 mg | ORAL_CAPSULE | ORAL | 0 refills | Status: DC

## 2016-09-25 MED FILL — DEXTROAMP-AMPHET ER 30 MG C: 30 | 30 days supply | Qty: 30 | Fill #0

## 2016-09-25 NOTE — Telephone Encounter (Signed)
  Requested Prescriptions   Pending Prescriptions Disp Refills  . amphetamine-dextroamphetamine (ADDERALL XR) 30 MG 24 hr capsule 30 capsule 0    Sig: Take 1 capsule (30 mg total) by mouth every morning.    Last OV:  08/14/16 Next OV:  10/22/16 UDS:  Moderate, 07/27/16 and due 10/25/16 Last Rx:  08/24/16.  Rx printed and forwarded to covering Provider for signature.

## 2016-09-28 MED FILL — LEVOTHYROXINE 175 MCG TAB: 175 | 20 days supply | Qty: 20 | Fill #10

## 2016-10-08 DIAGNOSIS — C73 Malignant neoplasm of thyroid gland: Secondary | ICD-10-CM | POA: Diagnosis not present

## 2016-10-08 DIAGNOSIS — E890 Postprocedural hypothyroidism: Secondary | ICD-10-CM | POA: Diagnosis not present

## 2016-10-18 MED FILL — LEVOTHYROXINE 175 MCG TAB: 175 | 30 days supply | Qty: 30 | Fill #0

## 2016-10-18 MED FILL — CLOMIPHENE CITRATE 50 MG TA: 50 | 30 days supply | Qty: 15 | Fill #6

## 2016-10-22 ENCOUNTER — Encounter: Payer: Self-pay | Admitting: Family

## 2016-10-22 ENCOUNTER — Ambulatory Visit (INDEPENDENT_AMBULATORY_CARE_PROVIDER_SITE_OTHER): Payer: BLUE CROSS/BLUE SHIELD | Admitting: Family

## 2016-10-22 VITALS — BP 130/89 | HR 68 | Temp 98.4°F | Resp 16 | Ht 69.5 in | Wt 204.4 lb

## 2016-10-22 DIAGNOSIS — E039 Hypothyroidism, unspecified: Secondary | ICD-10-CM

## 2016-10-22 DIAGNOSIS — N529 Male erectile dysfunction, unspecified: Secondary | ICD-10-CM

## 2016-10-22 DIAGNOSIS — F988 Other specified behavioral and emotional disorders with onset usually occurring in childhood and adolescence: Secondary | ICD-10-CM | POA: Diagnosis not present

## 2016-10-22 MED ORDER — AMPHETAMINE-DEXTROAMPHET ER 30 MG PO CP24: 30.0000 mg | ORAL_CAPSULE | ORAL | 0 refills | Status: DC

## 2016-10-22 MED FILL — CIALIS 10 MG TABLET: 10 | 16 days supply | Qty: 8 | Fill #0

## 2016-10-22 MED FILL — DEXTROAMP-AMPHET ER 30 MG C: 30 | 30 days supply | Qty: 30 | Fill #0

## 2016-10-22 NOTE — Assessment & Plan Note (Signed)
Stable on current dose of adderall, continue same, obtain UDS.

## 2016-10-22 NOTE — Progress Notes (Signed)
Pre visit review using our clinic review tool, if applicable. No additional management support is needed unless otherwise documented below in the visit note. 

## 2016-10-22 NOTE — Patient Instructions (Signed)
Please complete urine sample prior to leaving.

## 2016-10-22 NOTE — Assessment & Plan Note (Signed)
Stable, managed by urology.

## 2016-10-22 NOTE — Assessment & Plan Note (Signed)
Stable, managed by endo.

## 2016-10-22 NOTE — Progress Notes (Signed)
Subjective:    Patient ID: Zachary [name], male    DOB: Dec 30, 1975, 41 y.o.   MRN: [id number]  HPI  Zachary [name] is a 41 yr old male who presents today for follow up.    Hypothyroid- Maintained on synthroid, had TSH drawn by endocrinology. Feels well on current dose.   ADHD- reports that adderall continues to help.    Followed by [facility]. Maintained on cialis and clomid.   Review of Systems   See HPI  Past Medical History:  Diagnosis Date  . ADD (attention deficit disorder)   . History of papillary adenocarcinoma of thyroid    s/p thyroidectomy 06/2003  . History of tobacco abuse   . Iatrogenic hyperthyroidism      Social History   Social History  . Marital status: Married    Spouse name: N/A  . Number of children: N/A  . Years of education: N/A   Occupational History  . Not on file.   Social History Main Topics  . Smoking status: Former Research scientist (life sciences)  . Smokeless tobacco: Never Used     Comment: Quit  about 7 years ago. 3-5 pack year history  . Alcohol use Yes     Comment: 2-3 drinks per week  . Drug use: No  . Sexual activity: Not on file   Other Topics Concern  . 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  3 to 5 pack year history   Alcohol use-no          Past Surgical History:  Procedure Laterality Date  . THYROIDECTOMY  06/2003  . TONSILLECTOMY  1985    Family History  Problem Relation Age of Onset  . Achalasia Father 26  . Other Mother 19    healthy  . Hypertension Mother   . Coronary artery disease Paternal Grandmother   . Stroke Maternal Grandfather   . Diabetes Neg Hx     No Known Allergies  Current Outpatient Prescriptions on File Prior to Visit  Medication Sig Dispense Refill  . CIALIS 5 MG tablet Take 2.5 mg by mouth daily.    . clomiPHENE (CLOMID) 50 MG tablet Take 25 mg by mouth daily.    Marland Kitchen levothyroxine  (SYNTHROID, LEVOTHROID) 175 MCG tablet Take 175 mcg by mouth daily before breakfast.     No current facility-administered medications on file prior to visit.     BP 130/89 (BP Location: Left Arm, Cuff Size: Large)   Pulse 68   Temp 98.4 F (36.9 C) (Oral)   Resp 16   Ht 5' 9.5" (1.765 m)   Wt 204 lb 6.4 oz (92.7 kg)   SpO2 100% Comment: room air  BMI 29.75 kg/m         Objective:   Physical Exam  Constitutional: He is oriented to person, place, and time. He appears well-developed and well-nourished. No distress.  HENT:  Head: Normocephalic and atraumatic.  Cardiovascular: Normal rate and regular rhythm.   No murmur heard. Pulmonary/Chest: Effort normal and breath sounds normal. No respiratory distress. He has no wheezes. He has no rales.  Musculoskeletal: He exhibits no edema.  Neurological: He is alert and oriented to person, place, and time.  Skin: Skin is warm and dry.  Psychiatric: He has a normal mood and affect. His behavior is normal. Thought content normal.  Assessment & Plan:

## 2016-10-23 ENCOUNTER — Encounter: Payer: Self-pay | Admitting: Family

## 2016-10-23 DIAGNOSIS — Z79899 Other long term (current) drug therapy: Secondary | ICD-10-CM | POA: Diagnosis not present

## 2016-11-20 ENCOUNTER — Other Ambulatory Visit: Payer: Self-pay | Admitting: Family

## 2016-11-20 MED ORDER — AMPHETAMINE-DEXTROAMPHET ER 30 MG PO CP24: 30.0000 mg | ORAL_CAPSULE | ORAL | 0 refills | Status: DC

## 2016-11-20 MED FILL — LEVOTHYROXINE 175 MCG TAB: 175 | 30 days supply | Qty: 30 | Fill #0

## 2016-11-20 MED FILL — CLOMIPHENE CITRATE 50 MG TA: 50 | 30 days supply | Qty: 15 | Fill #7

## 2016-11-20 NOTE — Telephone Encounter (Signed)
Last adderall RX: 10/22/16, #60 Last OV: 10/22/16 Next OV: due 04/24/17 for cpe UDS: 10/22/16. Needs to update CSC when he picks up Rx. Contract printed and placed with Rx for PCP signature.

## 2016-11-20 NOTE — Telephone Encounter (Signed)
Patient has signed the contract already  [facility]

## 2016-11-21 NOTE — Telephone Encounter (Signed)
Rx placed at front desk for pick up and message sent to pt. 

## 2016-11-26 MED FILL — DEXTROAMP-AMPHET ER 30 MG C: 30 | 30 days supply | Qty: 30 | Fill #0

## 2016-11-26 MED FILL — CIALIS 10 MG TABLET: 10 | 16 days supply | Qty: 8 | Fill #1

## 2016-12-20 MED FILL — LEVOTHYROXINE 175 MCG TAB: 175 | 30 days supply | Qty: 30 | Fill #1

## 2016-12-20 MED FILL — CLOMIPHENE CITRATE 50 MG TA: 50 | 30 days supply | Qty: 15 | Fill #8

## 2016-12-26 ENCOUNTER — Other Ambulatory Visit: Payer: Self-pay | Admitting: Family

## 2016-12-27 MED ORDER — AMPHETAMINE-DEXTROAMPHET ER 30 MG PO CP24: 30.0000 mg | ORAL_CAPSULE | ORAL | 0 refills | Status: DC

## 2016-12-27 NOTE — Telephone Encounter (Signed)
Last Adderall RX: 11/20/16 Last OV: 10/22/16 Next OV: 04/22/17 UDS: 10/22/16 moderate and due 01/22/17  Rx printed and forwarded to PCP for signature.

## 2016-12-28 MED FILL — DEXTROAMP-AMPHET ER 30 MG C: 30 | 30 days supply | Qty: 30 | Fill #0

## 2017-01-18 MED FILL — LEVOTHYROXINE 175 MCG TAB: 175 | 30 days supply | Qty: 30 | Fill #2

## 2017-01-18 MED FILL — CLOMIPHENE CITRATE 50 MG TA: 50 | 30 days supply | Qty: 15 | Fill #9

## 2017-01-25 ENCOUNTER — Other Ambulatory Visit: Payer: Self-pay | Admitting: Family

## 2017-01-28 MED ORDER — AMPHETAMINE-DEXTROAMPHET ER 30 MG PO CP24: 30.0000 mg | ORAL_CAPSULE | ORAL | 0 refills | Status: DC

## 2017-01-28 NOTE — Telephone Encounter (Signed)
Patient informed via My Chart message/SLS 06/18

## 2017-01-28 NOTE — Telephone Encounter (Signed)
Refill request for - Adderall XR 30 mg tab Last filled by MD on - 12/27/16, #30x0 Last AEX - 10/22/16 Next AEX - 6-Mths [appt scheduled for 04/22/17] Please Advise on refills/SLS 06/18

## 2017-01-28 NOTE — Telephone Encounter (Signed)
Patient informed/via My Chart message/SLS 06/18 Hi Zachary [name],  Just wanted to let you know that Melissa approved your refill request and your prescription is ready for p/u at our front desk; someone will be in the office until 7:00pm tonight and we reopen at 7:00am tomorrow morning. Thank You for choosing [facility] !  Best Regards,  Fleet Contras, CMA AAMA

## 2017-01-28 NOTE — Telephone Encounter (Signed)
Duplicate request/SLS 52/59

## 2017-01-29 MED FILL — DEXTROAMP-AMPHET ER 30 MG C: 30 | 30 days supply | Qty: 30 | Fill #0

## 2017-02-15 MED FILL — CLOMIPHENE CITRATE 50 MG TA: 50 | 30 days supply | Qty: 15 | Fill #10

## 2017-02-15 MED FILL — LEVOTHYROXINE 175 MCG TAB: 175 | 30 days supply | Qty: 30 | Fill #3

## 2017-02-25 ENCOUNTER — Other Ambulatory Visit: Payer: Self-pay | Admitting: Family

## 2017-02-25 MED ORDER — AMPHETAMINE-DEXTROAMPHET ER 30 MG PO CP24: 30.0000 mg | ORAL_CAPSULE | ORAL | 0 refills | Status: DC

## 2017-02-25 NOTE — Telephone Encounter (Signed)
Last RX: 01/28/17, #30 Last OV: 10/22/16 Next OV: 04/22/17 UDS: 10/22/16 and due now. Will collect when he picks up Rx. Rx printed and forwarded to PCP for signature.

## 2017-02-25 NOTE — Telephone Encounter (Signed)
Rx placed at front desk and message sent to pt. 

## 2017-02-26 MED FILL — DEXTROAMP-AMPHET ER 30 MG C: 30 | 30 days supply | Qty: 30 | Fill #0

## 2017-02-27 DIAGNOSIS — Z79899 Other long term (current) drug therapy: Secondary | ICD-10-CM | POA: Diagnosis not present

## 2017-03-11 IMAGING — US US ABDOMEN LIMITED
1 series · 11 of 11 positions shown · non-contrast
Comparison: None.

CLINICAL DATA: The patient reports a stable palpable lump in the
mid to low back to the left of the spine. Duration of symptoms 9-12
months.

EXAM:
LIMITED ABDOMINAL ULTRASOUND

[Series 1: us abdomen limited · 0.05mm/px · 11 of 11 slices shown]
[im 1/11]
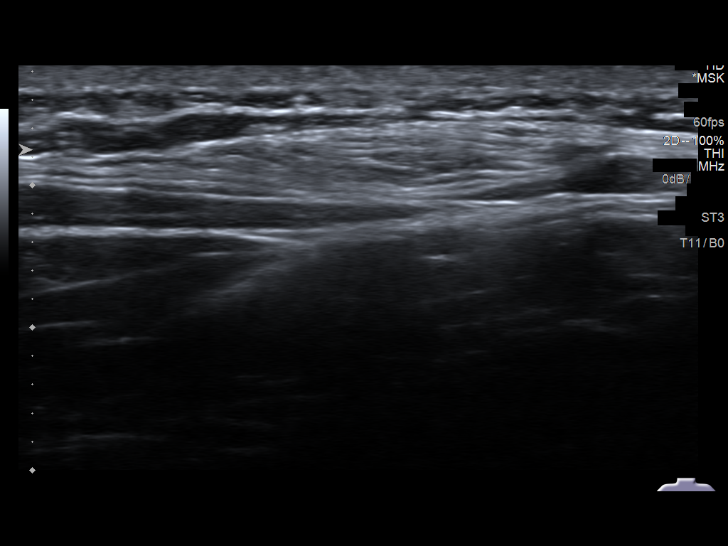
[im 2/11]
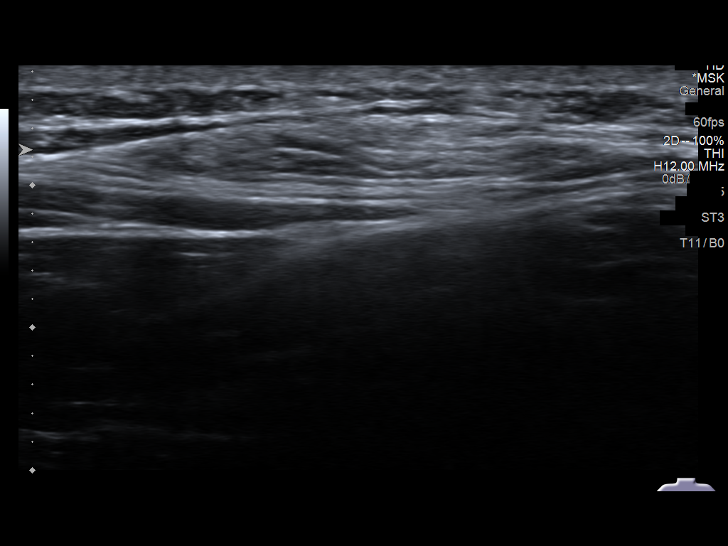
[im 3/11]
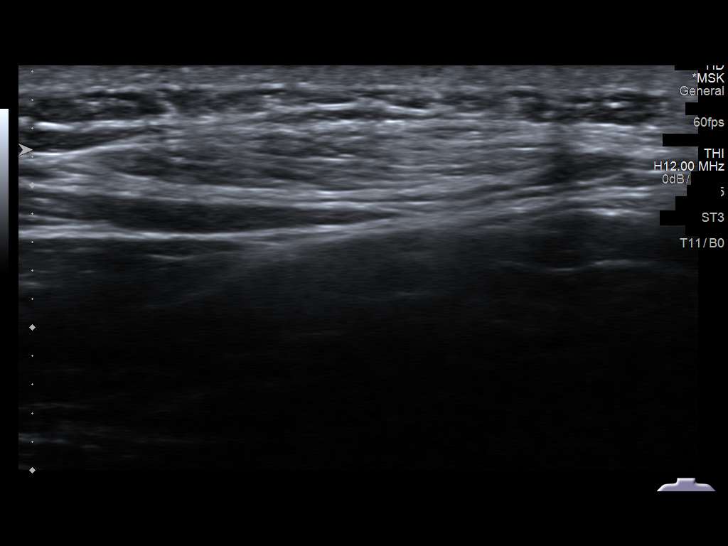
[im 4/11]
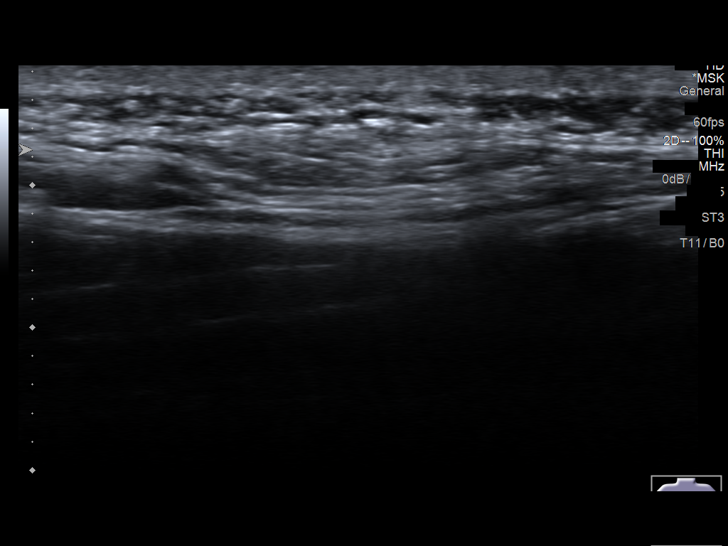
[im 5/11]
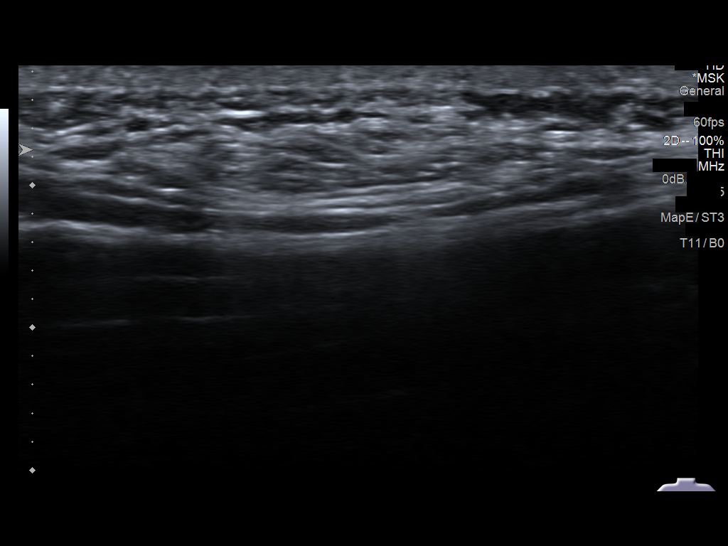
[im 6/11]
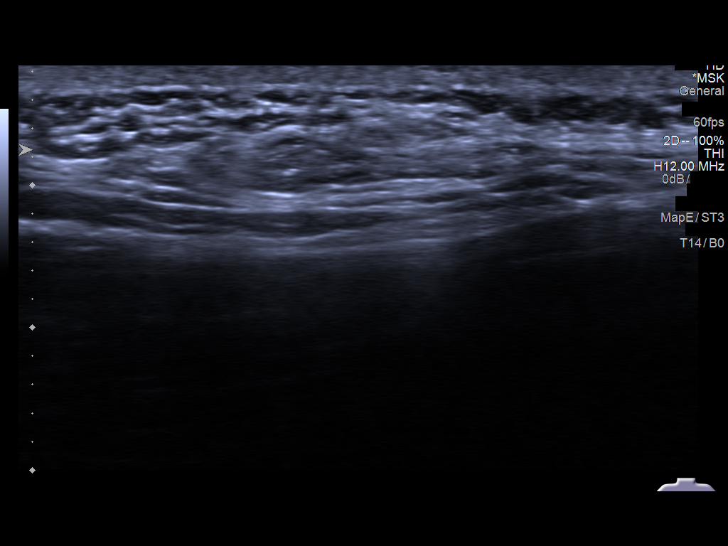
[im 7/11]
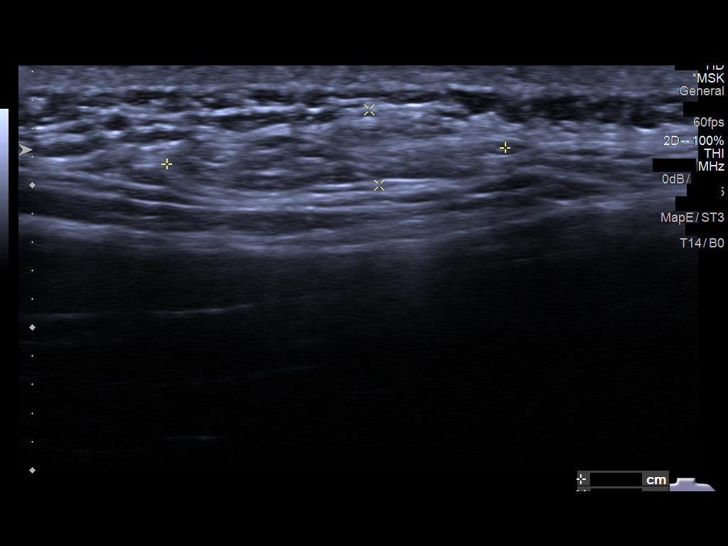
[im 8/11]
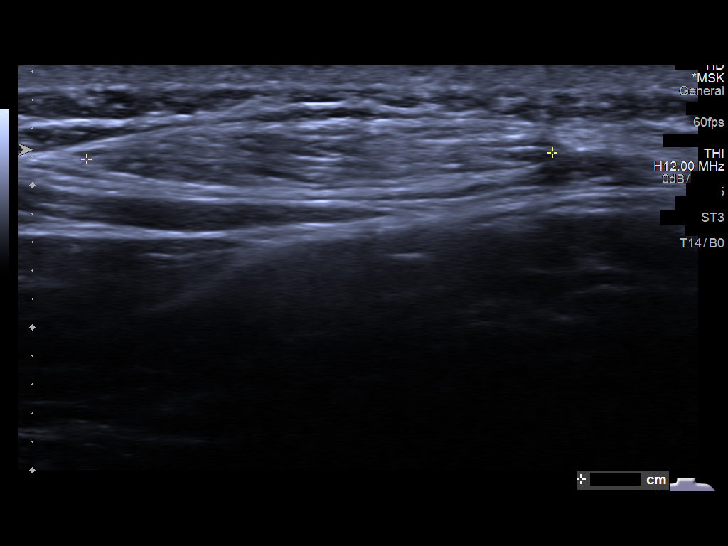
[im 9/11]
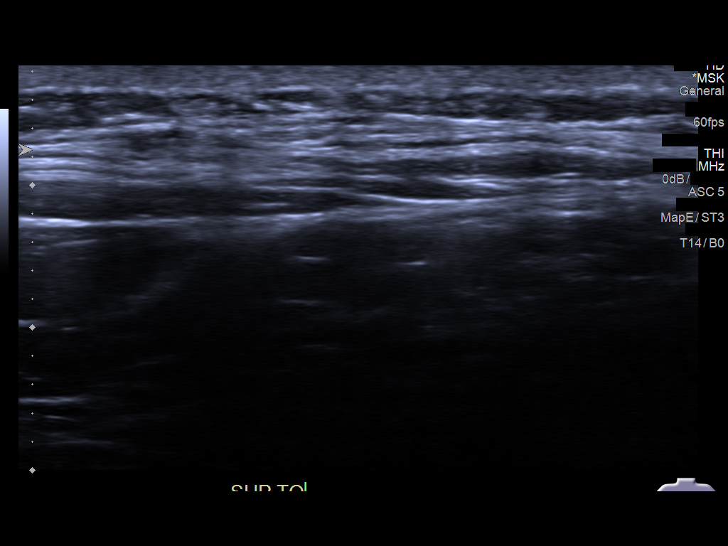
[im 10/11]
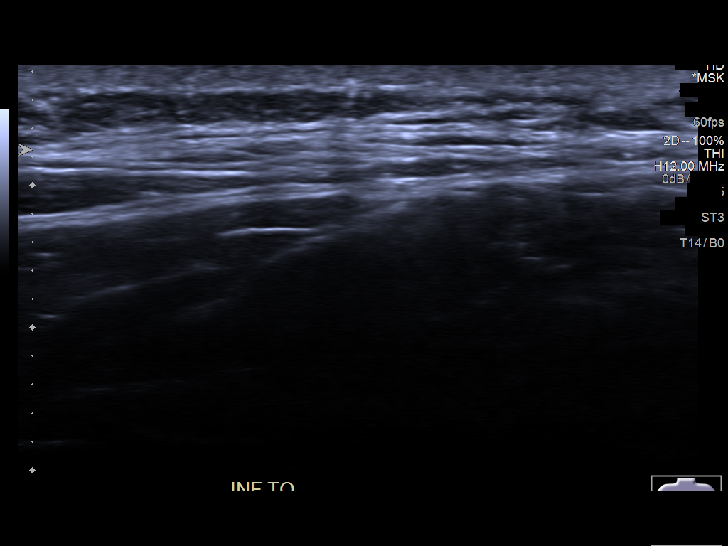
[im 11/11]
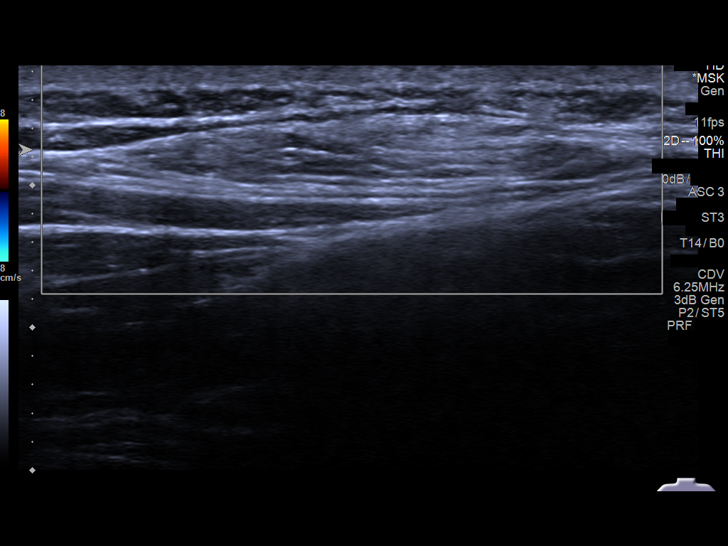

[11 of 11 positions shown; findings below may reference images not displayed]

FINDINGS: In the region of clinical concern there is thickening of the
subcutaneous tissues measured 2.4 x 3.3 x 0.5 cm. Of the imaged
parenchyma is heterogeneous but it is similar to that of surrounding
subcutaneous fat. No significant vascularity is observed.
IMPRESSION: Ovoid area of thickening in the subcutaneous fat of the left mid to
lower back lateral to the spine. Further tissue characterization
with MRI would be useful.

## 2017-03-19 MED FILL — LEVOTHYROXINE 175 MCG TAB: 175 | 30 days supply | Qty: 30 | Fill #4

## 2017-03-19 MED FILL — CLOMIPHENE CITRATE 50 MG TA: 50 | 30 days supply | Qty: 15 | Fill #11

## 2017-03-27 ENCOUNTER — Other Ambulatory Visit: Payer: Self-pay | Admitting: Family

## 2017-03-29 MED ORDER — AMPHETAMINE-DEXTROAMPHET ER 30 MG PO CP24: 30.0000 mg | ORAL_CAPSULE | ORAL | 0 refills | Status: DC

## 2017-03-29 MED FILL — DEXTROAMP-AMPHET ER 30 MG C: 30 | 30 days supply | Qty: 30 | Fill #0

## 2017-03-29 NOTE — Telephone Encounter (Signed)
Last RX: 02/25/17, #30 Last OV: 10/22/16 Next OV: 04/22/17 UDS: 02/27/17, moderate. Next 05/30/17.  Rx printed and forwarded to PCP for signature.

## 2017-04-18 MED FILL — LEVOTHYROXINE 175 MCG TAB: 175 | 30 days supply | Qty: 30 | Fill #5

## 2017-04-22 ENCOUNTER — Encounter: Payer: BLUE CROSS/BLUE SHIELD | Admitting: Family

## 2017-04-22 DIAGNOSIS — Z0289 Encounter for other administrative examinations: Secondary | ICD-10-CM

## 2017-04-25 ENCOUNTER — Other Ambulatory Visit: Payer: Self-pay | Admitting: Family

## 2017-04-25 DIAGNOSIS — N401 Enlarged prostate with lower urinary tract symptoms: Secondary | ICD-10-CM | POA: Diagnosis not present

## 2017-04-25 DIAGNOSIS — E291 Testicular hypofunction: Secondary | ICD-10-CM | POA: Diagnosis not present

## 2017-04-26 ENCOUNTER — Encounter: Payer: Self-pay | Admitting: Family

## 2017-04-26 ENCOUNTER — Other Ambulatory Visit: Payer: Self-pay | Admitting: Family

## 2017-04-26 ENCOUNTER — Ambulatory Visit (INDEPENDENT_AMBULATORY_CARE_PROVIDER_SITE_OTHER): Payer: BLUE CROSS/BLUE SHIELD | Admitting: Family

## 2017-04-26 VITALS — BP 125/78 | HR 58 | Temp 97.7°F | Resp 18 | Ht 69.5 in | Wt 190.4 lb

## 2017-04-26 DIAGNOSIS — F909 Attention-deficit hyperactivity disorder, unspecified type: Secondary | ICD-10-CM | POA: Diagnosis not present

## 2017-04-26 DIAGNOSIS — Z5181 Encounter for therapeutic drug level monitoring: Secondary | ICD-10-CM

## 2017-04-26 DIAGNOSIS — Z0000 Encounter for general adult medical examination without abnormal findings: Secondary | ICD-10-CM | POA: Diagnosis not present

## 2017-04-26 LAB — URINALYSIS, ROUTINE W REFLEX MICROSCOPIC
BILIRUBIN URINE: NEGATIVE
Glucose, UA: NEGATIVE
HGB URINE DIPSTICK: NEGATIVE
Ketones, ur: NEGATIVE
Leukocytes, UA: NEGATIVE
Nitrite: NEGATIVE
PH: 7 (ref 5.0–8.0)
Protein, ur: NEGATIVE
Specific Gravity, Urine: 1.007 (ref 1.001–1.03)

## 2017-04-26 MED ORDER — AMPHETAMINE-DEXTROAMPHET ER 30 MG PO CP24
30.0000 mg | ORAL_CAPSULE | ORAL | 0 refills | Status: DC
Start: 2017-04-26 — End: 2017-05-27

## 2017-04-26 MED FILL — DEXTROAMP-AMPHET ER 30 MG C: 30 | 30 days supply | Qty: 30 | Fill #0

## 2017-04-26 NOTE — Patient Instructions (Signed)
Please complete lab work prior to leaving.  °Keep up the good work with healthy diet and regular exercise.  °

## 2017-04-26 NOTE — Progress Notes (Signed)
Subjective:    Patient ID: Zachary [name], male    DOB: 1976-02-26, 41 y.o.   MRN: [id number]  HPI  Mr. Nakama is a 41 yr old male who presents today for complete physical.  Immunizations:  tdap up to date.   Diet: reports that he is eating better.  Has eliminated refined sugars. Exercise: some exercise (2-3 times a week) Vision:  Up to date Dental: due for cleaning.  Wt Readings from Last 3 Encounters:  04/26/17 190 lb 6.4 oz (86.4 kg)  10/22/16 204 lb 6.4 oz (92.7 kg)  08/14/16 204 lb 6.4 oz (92.7 kg)   ADHD- reports that symptoms remain well controlled on Adderall.     Review of Systems  Constitutional: Negative for unexpected weight change.  HENT: Negative for hearing loss and rhinorrhea.   Eyes: Negative for visual disturbance.  Respiratory: Negative for cough and shortness of breath.   Cardiovascular: Negative for chest pain and leg swelling.  Gastrointestinal: Negative for constipation and diarrhea.  Genitourinary: Negative for dysuria and frequency.  Musculoskeletal: Negative for arthralgias and myalgias.  Skin: Negative for rash.  Neurological: Negative for headaches.  Hematological: Negative for adenopathy.  Psychiatric/Behavioral:       Denies depression/anxiety   Past Medical History:  Diagnosis Date  . ADD (attention deficit disorder)   . History of papillary adenocarcinoma of thyroid    s/p thyroidectomy 06/2003  . History of tobacco abuse   . Iatrogenic hyperthyroidism      Social History   Social History  . Marital status: Married    Spouse name: N/A  . Number of children: N/A  . Years of education: N/A   Occupational History  . Not on file.   Social History Main Topics  . Smoking status: Former Research scientist (life sciences)  . Smokeless tobacco: Never Used     Comment: Quit  about 7 years ago. 3-5 pack year history  . Alcohol use Yes     Comment: 2-3 drinks per week  . Drug use: No  . Sexual activity: Not on file   Other Topics Concern  . 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  3 to 5 pack year history   Alcohol use-no          Past Surgical History:  Procedure Laterality Date  . THYROIDECTOMY  06/2003  . TONSILLECTOMY  1985    Family History  Problem Relation Age of Onset  . Achalasia Father 56  . Other Mother 32       healthy  . Hypertension Mother   . Coronary artery disease Paternal Grandmother   . Stroke Maternal Grandfather   . Diabetes Neg Hx     No Known Allergies  Current Outpatient Prescriptions on File Prior to Visit  Medication Sig Dispense Refill  . amphetamine-dextroamphetamine (ADDERALL XR) 30 MG 24 hr capsule Take 1 capsule (30 mg total) by mouth every morning. 30 capsule 0  . CIALIS 5 MG tablet Take 2.5 mg by mouth daily.    . clomiPHENE (CLOMID) 50 MG tablet Take 25 mg by mouth daily.    Marland Kitchen levothyroxine (SYNTHROID, LEVOTHROID) 175 MCG tablet Take 175 mcg by mouth daily before breakfast.     No current facility-administered medications on file prior to visit.     BP 125/78 (BP Location: Left Arm, Cuff Size: Normal)  Pulse (!) 58   Temp 97.7 F (36.5 C) (Oral)   Resp 18   Ht 5' 9.5" (1.765 m)   Wt 190 lb 6.4 oz (86.4 kg)   SpO2 100%   BMI 27.71 kg/m       Objective:   Physical Exam Physical Exam  Constitutional: He is oriented to person, place, and time. He appears well-developed and well-nourished. No distress.  HENT:  Head: Normocephalic and atraumatic.  Right Ear: Tympanic membrane and ear canal normal.  Left Ear: Tympanic membrane and ear canal normal.  Mouth/Throat: Oropharynx is clear and moist.  Eyes: Pupils are equal, round, and reactive to light. No scleral icterus.  Neck: Normal range of motion. No thyromegaly present.  Cardiovascular: Normal rate and regular rhythm.   No murmur heard. Pulmonary/Chest: Effort normal and breath sounds normal. No  respiratory distress. He has no wheezes. He has no rales. He exhibits no tenderness.  Abdominal: Soft. Bowel sounds are normal. He exhibits no distension and no mass. There is no tenderness. There is no rebound and no guarding.  Musculoskeletal: He exhibits no edema.  Lymphadenopathy:    He has no cervical adenopathy.  Neurological: He is alert and oriented to person, place, and time. He has normal patellar reflexes. He exhibits normal muscle tone. Coordination normal.  Skin: Skin is warm and dry.  Psychiatric: He has a normal mood and affect. His behavior is normal. Judgment and thought content normal.           Assessment & Plan:         Assessment & Plan:  Preventative Care- discussed healthy diet, regular exercise and modest weight loss. Goal weight 175-180/. EKG tracing is personally reviewed.  EKG notes NSR.  No acute changes. Declines flu shot. Tetanus up to date.  ADHD- stable on current dose of adderall. Continue same. Obtain follow up UDS.

## 2017-04-30 DIAGNOSIS — N529 Male erectile dysfunction, unspecified: Secondary | ICD-10-CM | POA: Diagnosis not present

## 2017-04-30 DIAGNOSIS — E291 Testicular hypofunction: Secondary | ICD-10-CM | POA: Diagnosis not present

## 2017-05-01 ENCOUNTER — Telehealth: Payer: Self-pay | Admitting: Family

## 2017-05-01 LAB — CBC WITH DIFFERENTIAL/PLATELET
BASOS PCT: 0.8 %
Basophils Absolute: 52 cells/uL (ref 0–200)
EOS ABS: 293 {cells}/uL (ref 15–500)
Eosinophils Relative: 4.5 %
HCT: 48.2 % (ref 38.5–50.0)
HEMOGLOBIN: 16.4 g/dL (ref 13.2–17.1)
LYMPHS ABS: 1833 {cells}/uL (ref 850–3900)
MCH: 28.9 pg (ref 27.0–33.0)
MCHC: 34 g/dL (ref 32.0–36.0)
MCV: 85 fL (ref 80.0–100.0)
MPV: 10.3 fL (ref 7.5–12.5)
Monocytes Relative: 7.6 %
NEUTROS ABS: 3829 {cells}/uL (ref 1500–7800)
Neutrophils Relative %: 58.9 %
PLATELETS: 275 10*3/uL (ref 140–400)
RBC: 5.67 10*6/uL (ref 4.20–5.80)
RDW: 12.8 % (ref 11.0–15.0)
Total Lymphocyte: 28.2 %
WBC: 6.5 10*3/uL (ref 3.8–10.8)
WBCMIX: 494 {cells}/uL (ref 200–950)

## 2017-05-01 LAB — BASIC METABOLIC PANEL
BUN: 15 mg/dL (ref 7–25)
CO2: 25 mmol/L (ref 20–32)
CREATININE: 1.06 mg/dL (ref 0.60–1.35)
Calcium: 9.8 mg/dL (ref 8.6–10.3)
Chloride: 101 mmol/L (ref 98–110)
Glucose, Bld: 92 mg/dL (ref 65–99)
Potassium: 4.4 mmol/L (ref 3.5–5.3)
Sodium: 137 mmol/L (ref 135–146)

## 2017-05-01 LAB — PAIN MGMT, PROFILE 8 W/CONF, U
6 Acetylmorphine: NEGATIVE ng/mL (ref <10)
ALCOHOL METABOLITES: NEGATIVE ng/mL (ref <500)
Amphetamine: 1945 ng/mL — ABNORMAL HIGH (ref <250)
Amphetamines: POSITIVE ng/mL — AB (ref <500)
BENZODIAZEPINES: NEGATIVE ng/mL (ref <100)
Buprenorphine, Urine: NEGATIVE ng/mL (ref <5)
COCAINE METABOLITE: NEGATIVE ng/mL (ref <150)
Creatinine: 44.2 mg/dL
MARIJUANA METABOLITE: NEGATIVE ng/mL (ref <20)
MDMA: NEGATIVE ng/mL (ref <500)
METHAMPHETAMINE: NEGATIVE ng/mL (ref <250)
OPIATES: NEGATIVE ng/mL (ref <100)
Oxidant: NEGATIVE ug/mL (ref <200)
Oxycodone: NEGATIVE ng/mL (ref <100)
pH: 6.49 (ref 4.5–9.0)

## 2017-05-01 LAB — HEPATIC FUNCTION PANEL
AG RATIO: 2 (calc) (ref 1.0–2.5)
ALBUMIN MSPROF: 4.5 g/dL (ref 3.6–5.1)
ALT: 13 U/L (ref 9–46)
AST: 12 U/L (ref 10–40)
Alkaline phosphatase (APISO): 61 U/L (ref 40–115)
BILIRUBIN DIRECT: 0.1 mg/dL (ref 0.0–0.2)
BILIRUBIN TOTAL: 0.6 mg/dL (ref 0.2–1.2)
GLOBULIN: 2.2 g/dL (ref 1.9–3.7)
Indirect Bilirubin: 0.5 mg/dL (calc) (ref 0.2–1.2)
Total Protein: 6.7 g/dL (ref 6.1–8.1)

## 2017-05-01 LAB — URINALYSIS, ROUTINE W REFLEX MICROSCOPIC
Bilirubin Urine: NEGATIVE
GLUCOSE, UA: NEGATIVE
Hgb urine dipstick: NEGATIVE
KETONES UR: NEGATIVE
LEUKOCYTES UA: NEGATIVE
NITRITE: NEGATIVE
PH: 7 (ref 5.0–8.0)
Protein, ur: NEGATIVE
SPECIFIC GRAVITY, URINE: 1.007 (ref 1.001–1.03)

## 2017-05-01 LAB — LIPID PANEL
CHOL/HDL RATIO: 3.8 (calc) (ref <5.0)
Cholesterol: 182 mg/dL (ref <200)
HDL: 48 mg/dL (ref >40)
LDL CHOLESTEROL (CALC): 117 mg/dL — AB
NON-HDL CHOLESTEROL (CALC): 134 mg/dL — AB (ref <130)
Triglycerides: 80 mg/dL (ref <150)

## 2017-05-01 LAB — TSH: TSH: 6.16 mIU/L — ABNORMAL HIGH (ref 0.40–4.50)

## 2017-05-01 NOTE — Telephone Encounter (Signed)
Please let pt know that it looks like his synthroid dose needs to be increased.  I would like to defer med change to Dr. Posey Pronto (his endocrinologist).  Please forward lab result to Dr. Posey Pronto.

## 2017-05-02 NOTE — Telephone Encounter (Signed)
TF-I spoke to the pt/he wanted to know the TSH number/I cannot find the number/can you look? He said it was fine to fax to Dr. Posey Pronto and that he would wait for him to call him but I still don't see the actual number/plz advise/thx dmf

## 2017-05-02 NOTE — Telephone Encounter (Signed)
Patent returning call.  

## 2017-05-02 NOTE — Telephone Encounter (Signed)
LMOVM stating that I would like for him to RTC as we will need to get his labs to Dr. Posey Pronto and to go over them with him/thx dmf

## 2017-05-03 NOTE — Telephone Encounter (Signed)
Result faxed to Dr Posey Pronto. Results have been released to mychart and message was sent to pt.

## 2017-05-10 MED FILL — CLOMIPHENE CITRATE 50 MG TA: 50 | 30 days supply | Qty: 15 | Fill #0

## 2017-05-20 MED FILL — LEVOTHYROXINE 175 MCG TAB: 175 | 30 days supply | Qty: 30 | Fill #6

## 2017-05-27 ENCOUNTER — Other Ambulatory Visit: Payer: BLUE CROSS/BLUE SHIELD

## 2017-05-27 ENCOUNTER — Other Ambulatory Visit: Payer: Self-pay | Admitting: Family

## 2017-05-27 DIAGNOSIS — F988 Other specified behavioral and emotional disorders with onset usually occurring in childhood and adolescence: Secondary | ICD-10-CM | POA: Diagnosis not present

## 2017-05-27 MED ORDER — AMPHETAMINE-DEXTROAMPHET ER 30 MG PO CP24: 30.0000 mg | ORAL_CAPSULE | ORAL | 0 refills | Status: DC

## 2017-05-27 MED FILL — DEXTROAMP-AMPHET ER 30 MG C: 30 | 30 days supply | Qty: 30 | Fill #0

## 2017-05-27 NOTE — Telephone Encounter (Signed)
Rx placed at front desk for pick up and email sent to pt.

## 2017-05-27 NOTE — Telephone Encounter (Signed)
Last RX: 04/26/17, #30 Last OV: 04/26/17 Next OV: 10/25/17 UDS: 02/27/17 MODERATE, due now.  Rx printed and forwarded to PCP for signature.

## 2017-05-30 LAB — PAIN MGMT, PROFILE 8 W/CONF, U
6 ACETYLMORPHINE: NEGATIVE ng/mL (ref <10)
AMPHETAMINE: 3307 ng/mL — AB (ref <250)
AMPHETAMINES: POSITIVE ng/mL — AB (ref <500)
Alcohol Metabolites: NEGATIVE ng/mL (ref <500)
BENZODIAZEPINES: NEGATIVE ng/mL (ref <100)
BUPRENORPHINE, URINE: NEGATIVE ng/mL (ref <5)
Cocaine Metabolite: NEGATIVE ng/mL (ref <150)
Creatinine: 36.9 mg/dL
MARIJUANA METABOLITE: NEGATIVE ng/mL (ref <20)
MDMA: NEGATIVE ng/mL (ref <500)
METHAMPHETAMINE: NEGATIVE ng/mL (ref <250)
OPIATES: NEGATIVE ng/mL (ref <100)
Oxidant: NEGATIVE ug/mL (ref <200)
Oxycodone: NEGATIVE ng/mL (ref <100)
pH: 6.79 (ref 4.5–9.0)

## 2017-06-18 MED FILL — LEVOTHYROXINE 175 MCG TAB: 175 | 30 days supply | Qty: 30 | Fill #7

## 2017-06-18 MED FILL — CLOMIPHENE CITRATE 50 MG TA: 50 | 30 days supply | Qty: 15 | Fill #1

## 2017-06-27 ENCOUNTER — Telehealth: Payer: Self-pay | Admitting: Family

## 2017-06-28 MED ORDER — AMPHETAMINE-DEXTROAMPHET ER 30 MG PO CP24: 30.0000 mg | ORAL_CAPSULE | ORAL | 0 refills | Status: DC

## 2017-06-28 MED FILL — DEXTROAMP-AMPHET ER 30 MG C: 30 | 30 days supply | Qty: 30 | Fill #0

## 2017-06-28 NOTE — Telephone Encounter (Signed)
Last adderall RX: 05/27/17, #30 Last OV: 04/26/17 Next OV: 10/25/17 UDS: 05/27/17  Rx printed and forwarded to PCP for signature.

## 2017-06-28 NOTE — Telephone Encounter (Signed)
Patient checking on the status of message below, informed patient PCP currently in clinic and nurse will call when Rx is ready for pick up. The patient wanted me to note he will be going out of town this weekend and would like to pick up today.

## 2017-06-28 NOTE — Telephone Encounter (Signed)
Rx placed at front desk and message sent to pt.

## 2017-07-19 DIAGNOSIS — J00 Acute nasopharyngitis [common cold]: Secondary | ICD-10-CM | POA: Diagnosis not present

## 2017-07-19 MED FILL — CLOMIPHENE CITRATE 50 MG TA: 50 | 30 days supply | Qty: 15 | Fill #2

## 2017-07-19 MED FILL — LEVOTHYROXINE 175 MCG TAB: 175 | 30 days supply | Qty: 30 | Fill #8

## 2017-07-25 ENCOUNTER — Other Ambulatory Visit: Payer: Self-pay | Admitting: Family

## 2017-07-25 NOTE — Telephone Encounter (Signed)
Last RX: 06/28/17, #30 Last OV: 04/26/17 Next OV: 10/25/17 UDS: 05/27/17, moderate CSC: 11/20/16 CSR:  No discrepancies identified.  Please advise?

## 2017-07-26 MED ORDER — AMPHETAMINE-DEXTROAMPHET ER 30 MG PO CP24: 30.0000 mg | ORAL_CAPSULE | ORAL | 0 refills | Status: DC

## 2017-07-26 MED FILL — AZITHROMYCIN 250 MG TABLET: 250 | 5 days supply | Qty: 6 | Fill #0

## 2017-07-26 MED FILL — DEXTROAMP-AMPHET ER 30 MG C: 30 | 30 days supply | Qty: 30 | Fill #0

## 2017-08-16 MED FILL — LEVOTHYROXINE 175 MCG TAB: 175 | 30 days supply | Qty: 30 | Fill #9

## 2017-08-16 MED FILL — CLOMIPHENE CITRATE 50 MG TA: 50 | 30 days supply | Qty: 15 | Fill #3

## 2017-08-26 ENCOUNTER — Other Ambulatory Visit: Payer: Self-pay | Admitting: Family

## 2017-08-26 MED ORDER — AMPHETAMINE-DEXTROAMPHET ER 30 MG PO CP24: 30.0000 mg | ORAL_CAPSULE | ORAL | 0 refills | Status: DC

## 2017-08-26 MED FILL — DEXTROAMP-AMPHET ER 30 MG C: 30 | 30 days supply | Qty: 30 | Fill #0

## 2017-08-26 NOTE — Telephone Encounter (Signed)
Please advise?  Last adderall XR RX:  07/26/17, #30 Last OV: 04/26/17 Next OV: 10/24/17 UDS: 05/17/17 CSC: 11/20/16 CSR: No discrepancies identified

## 2017-09-18 MED FILL — CLOMIPHENE CITRATE 50 MG TA: 50 | 30 days supply | Qty: 15 | Fill #4

## 2017-09-18 MED FILL — LEVOTHYROXINE 175 MCG TAB: 175 | 30 days supply | Qty: 30 | Fill #10

## 2017-09-25 ENCOUNTER — Other Ambulatory Visit: Payer: Self-pay | Admitting: Family

## 2017-09-25 NOTE — Telephone Encounter (Signed)
Requesting:Adderall Contract: 11/20/16 UDS: 05/27/17 moderate risk Last Visit:04/26/17 Next Visit: 10/25/17 Last Refill: 08/26/17  Please Advise

## 2017-09-26 MED ORDER — AMPHETAMINE-DEXTROAMPHET ER 30 MG PO CP24: 30.0000 mg | ORAL_CAPSULE | ORAL | 0 refills | Status: DC

## 2017-09-26 MED FILL — DEXTROAMP-AMPHET ER 30 MG C: 30 | 30 days supply | Qty: 30 | Fill #0

## 2017-10-08 DIAGNOSIS — E890 Postprocedural hypothyroidism: Secondary | ICD-10-CM | POA: Diagnosis not present

## 2017-10-08 DIAGNOSIS — C73 Malignant neoplasm of thyroid gland: Secondary | ICD-10-CM | POA: Diagnosis not present

## 2017-10-18 MED FILL — CLOMIPHENE CITRATE 50 MG TA: 50 | 30 days supply | Qty: 15 | Fill #5

## 2017-10-25 ENCOUNTER — Ambulatory Visit: Payer: BLUE CROSS/BLUE SHIELD | Admitting: Family

## 2017-10-25 ENCOUNTER — Encounter: Payer: Self-pay | Admitting: Family

## 2017-10-25 VITALS — BP 130/80 | HR 83 | Temp 98.7°F | Resp 16 | Ht 69.5 in | Wt 196.4 lb

## 2017-10-25 DIAGNOSIS — E291 Testicular hypofunction: Secondary | ICD-10-CM | POA: Diagnosis not present

## 2017-10-25 DIAGNOSIS — F909 Attention-deficit hyperactivity disorder, unspecified type: Secondary | ICD-10-CM

## 2017-10-25 DIAGNOSIS — Z85850 Personal history of malignant neoplasm of thyroid: Secondary | ICD-10-CM | POA: Diagnosis not present

## 2017-10-25 MED ORDER — AMPHETAMINE-DEXTROAMPHET ER 30 MG PO CP24: 30.0000 mg | ORAL_CAPSULE | ORAL | 0 refills | Status: DC

## 2017-10-25 MED FILL — DEXTROAMP-AMPHET ER 30 MG C: 30 | 30 days supply | Qty: 30 | Fill #0

## 2017-10-25 NOTE — Progress Notes (Signed)
Subjective:    Patient ID: Zachary [name], male    DOB: 02-01-1976, 42 y.o.   MRN: [id number]  HPI  ADHD-sleeping well.    Also on clomid per urology.     Review of Systems     Past Medical History:  Diagnosis Date  . ADD (attention deficit disorder)   . History of papillary adenocarcinoma of thyroid    s/p thyroidectomy 06/2003  . History of tobacco abuse   . Iatrogenic hyperthyroidism      Social History   Socioeconomic History  . Marital status: Married    Spouse name: Not on file  . Number of children: Not on file  . Years of education: Not on file  . Highest education level: Not on file  Social Needs  . Financial resource strain: Not on file  . Food insecurity - worry: Not on file  . Food insecurity - inability: Not on file  . Transportation needs - medical: Not on file  . Transportation needs - non-medical: Not on file  Occupational History  . Not on file  Tobacco Use  . Smoking status: Former Research scientist (life sciences)  . Smokeless tobacco: Never Used  . Tobacco comment: Quit  about 7 years ago. 3-5 pack year history  Substance and Sexual Activity  . Alcohol use: Yes    Comment: 2-3 drinks per week  . Drug use: No  . Sexual activity: Not on file  Other Topics Concern  . 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 THYROIDECTOMY  06/2003  . TONSILLECTOMY  1985    Family History  Problem Relation Age of Onset  . Achalasia Father 24  . Other Mother 73       healthy  . Hypertension Mother   . Coronary artery disease Paternal Grandmother   . Stroke Maternal Grandfather   . Diabetes Neg Hx     No Known Allergies  Current Outpatient Medications on File Prior to Visit    Medication Sig Dispense Refill  . amphetamine-dextroamphetamine (ADDERALL XR) 30 MG 24 hr capsule Take 1 capsule (30 mg total) by mouth every morning. 30 capsule 0  . CIALIS 5 MG tablet Take 2.5 mg by mouth daily.    . clomiPHENE (CLOMID) 50 MG tablet Take 25 mg by mouth daily.    Marland Kitchen levothyroxine (SYNTHROID, LEVOTHROID) 200 MCG tablet Take 1 tablet by mouth daily.     No current facility-administered medications on file prior to visit.     BP 130/80 (BP Location: Left Arm, Cuff Size: Normal)   Pulse 83   Temp 98.7 F (37.1 C) (Oral)   Resp 16   Ht 5' 9.5" (1.765 m)   Wt 196 lb 6.4 oz (89.1 kg)   SpO2 98%   BMI 28.59 kg/m    Objective:   Physical Exam  Constitutional: He is oriented to person, place, and time. He appears well-developed and well-nourished. No distress.  HENT:  Head: Normocephalic and atraumatic.  Cardiovascular: Normal rate and regular rhythm.  No murmur heard. Pulmonary/Chest: Effort normal and breath sounds normal. No respiratory distress. He has no wheezes.  He has no rales.  Musculoskeletal: He exhibits no edema.  Neurological: He is alert and oriented to person, place, and time.  Skin: Skin is warm and dry.  Psychiatric: He has a normal mood and affect. His behavior is normal. Thought content normal.          Assessment & Plan:  ADHD- clinically stable on adderall. Reviewed [place] Controlled Substance registry and refills are appropriate. Controlled substance contract is renewed today and a uds will be obtained.   Hx thyroid cancer- maintained on synthroid and managed by endo.  Hypogonadism- he is maintained on clomid which he states is being managed by urology.

## 2017-10-25 NOTE — Patient Instructions (Addendum)
Please complete lab work prior to leaving.   

## 2017-10-28 LAB — PAIN MGMT, PROFILE 8 W/CONF, U
6 ACETYLMORPHINE: NEGATIVE ng/mL (ref <10)
ALCOHOL METABOLITES: NEGATIVE ng/mL (ref <500)
AMPHETAMINES: POSITIVE ng/mL — AB (ref <500)
Amphetamine: 3015 ng/mL — ABNORMAL HIGH (ref <250)
BENZODIAZEPINES: NEGATIVE ng/mL (ref <100)
Buprenorphine, Urine: NEGATIVE ng/mL (ref <5)
COCAINE METABOLITE: NEGATIVE ng/mL (ref <150)
Creatinine: 67.7 mg/dL
MDMA: NEGATIVE ng/mL (ref <500)
Marijuana Metabolite: NEGATIVE ng/mL (ref <20)
Methamphetamine: NEGATIVE ng/mL (ref <250)
OPIATES: NEGATIVE ng/mL (ref <100)
Oxidant: NEGATIVE ug/mL (ref <200)
Oxycodone: NEGATIVE ng/mL (ref <100)
pH: 6.98 (ref 4.5–9.0)

## 2017-11-21 MED FILL — CLOMIPHENE CITRATE 50 MG TA: 50 | 30 days supply | Qty: 15 | Fill #6

## 2017-11-22 ENCOUNTER — Other Ambulatory Visit: Payer: Self-pay | Admitting: Family

## 2017-11-22 NOTE — Telephone Encounter (Signed)
Last adderall RX: 10/25/17, #30 Last OV: 10/25/17 Next OV: 04/29/18 UDS: 10/25/17, moderate CSC: 10/25/17 CSR: No discrepancies identified

## 2017-11-26 ENCOUNTER — Other Ambulatory Visit: Payer: Self-pay | Admitting: Family

## 2017-11-27 MED ORDER — AMPHETAMINE-DEXTROAMPHET ER 30 MG PO CP24: 30.0000 mg | ORAL_CAPSULE | ORAL | 0 refills | Status: DC

## 2017-11-27 MED FILL — AMPHETAMINE-DEXTROAMPHET ER: 30 | 30 days supply | Qty: 30 | Fill #0

## 2017-11-27 NOTE — Telephone Encounter (Signed)
Rx signed by covering Provider, Larose Kells and Rx placed at front desk for pt to pick up. Email sent to pt.

## 2017-11-27 NOTE — Telephone Encounter (Signed)
See 11/22/17 patient email.

## 2017-12-25 ENCOUNTER — Other Ambulatory Visit: Payer: Self-pay | Admitting: Internal Medicine

## 2017-12-25 NOTE — Telephone Encounter (Signed)
Last adderall XR RX: 11/27/17, #30 Last OV: 10/25/17 Next OV: 04/29/18 UDS: 10/25/17, moderate CSC: 10/25/17 CSR:  No discrepancies identified

## 2017-12-26 ENCOUNTER — Other Ambulatory Visit: Payer: Self-pay | Admitting: Family Medicine

## 2017-12-26 DIAGNOSIS — R41840 Attention and concentration deficit: Secondary | ICD-10-CM

## 2017-12-26 MED ORDER — AMPHETAMINE-DEXTROAMPHET ER 30 MG PO CP24: 30.0000 mg | ORAL_CAPSULE | ORAL | 0 refills | Status: DC

## 2017-12-26 MED FILL — CLOMIPHENE CITRATE 50 MG TA: 50 | 30 days supply | Qty: 15 | Fill #7

## 2017-12-26 MED FILL — DEXTROAMP-AMPHET ER 30 MG C: 30 | 30 days supply | Qty: 30 | Fill #0

## 2017-12-26 NOTE — Progress Notes (Signed)
Pt came to pick up prescription- per record- it was printed by [place] today- earliest fill date today? Spoke w/ Dr. Etter Sjogren she verified- last [facility] fill date was 11/27/2017- Dr. Etter Sjogren resent Adderall script downstairs for Pt.

## 2018-01-23 ENCOUNTER — Other Ambulatory Visit: Payer: Self-pay | Admitting: Family Medicine

## 2018-01-23 DIAGNOSIS — R41840 Attention and concentration deficit: Secondary | ICD-10-CM

## 2018-01-24 MED ORDER — AMPHETAMINE-DEXTROAMPHET ER 30 MG PO CP24: 30.0000 mg | ORAL_CAPSULE | ORAL | 0 refills | Status: DC

## 2018-01-24 MED FILL — DEXTROAMP-AMPHET ER 30 MG C: 30 | 30 days supply | Qty: 30 | Fill #0

## 2018-01-24 MED FILL — CLOMIPHENE CITRATE 50 MG TA: 50 | 30 days supply | Qty: 15 | Fill #8

## 2018-01-24 NOTE — Telephone Encounter (Signed)
Requesting: Adderall XR 30mg  qday Contract: yes, 2019 UDS: 10/25/17 Last OV: 10/25/17 Next Ov: 04/29/18 Last refill: 12/26/17 Database: No discrepancies found.

## 2018-02-25 ENCOUNTER — Other Ambulatory Visit: Payer: Self-pay | Admitting: Family

## 2018-02-25 DIAGNOSIS — R41840 Attention and concentration deficit: Secondary | ICD-10-CM

## 2018-02-26 ENCOUNTER — Other Ambulatory Visit: Payer: Self-pay | Admitting: Family

## 2018-02-26 DIAGNOSIS — R41840 Attention and concentration deficit: Secondary | ICD-10-CM

## 2018-02-26 NOTE — Telephone Encounter (Signed)
Last adderall XR RX: 01/24/18, #30 Last OV: 10/25/17 Next OV: 04/29/18 UDS: 10/25/17, moderate CSC: 10/25/17 CSR: No discrepancies identified

## 2018-02-27 MED ORDER — AMPHETAMINE-DEXTROAMPHET ER 30 MG PO CP24: 30.0000 mg | ORAL_CAPSULE | ORAL | 0 refills | Status: DC

## 2018-02-27 MED FILL — DEXTROAMP-AMPHET ER 30 MG C: 30 | 30 days supply | Qty: 30 | Fill #0

## 2018-02-27 MED FILL — CLOMIPHENE CITRATE 50 MG TA: 50 | 30 days supply | Qty: 15 | Fill #9

## 2018-03-26 ENCOUNTER — Telehealth: Payer: Self-pay | Admitting: Family

## 2018-03-26 ENCOUNTER — Other Ambulatory Visit: Payer: Self-pay | Admitting: Family

## 2018-03-26 DIAGNOSIS — R41840 Attention and concentration deficit: Secondary | ICD-10-CM

## 2018-03-26 MED ORDER — AMPHETAMINE-DEXTROAMPHET ER 30 MG PO CP24: 30.0000 mg | ORAL_CAPSULE | ORAL | 0 refills | Status: DC

## 2018-03-26 NOTE — Telephone Encounter (Signed)
Refill sent.  He can resign at his next follow up please.

## 2018-03-26 NOTE — Telephone Encounter (Signed)
Last adderall RX: 02/27/18, #30 Last OV:  10/25/17 Next OV: 04/29/18 UDS: 10/25/17, moderate CSC: 10/25/17 but signature page is not scanned into Epic. Should pt resign? CSR: No discrepancies identified

## 2018-03-27 MED FILL — CLOMIPHENE CITRATE 50 MG TA: 50 | 30 days supply | Qty: 15 | Fill #10

## 2018-03-27 MED FILL — DEXTROAMP-AMPHET ER 30 MG C: 30 | 30 days supply | Qty: 30 | Fill #0

## 2018-03-28 NOTE — Telephone Encounter (Signed)
Note added to appt note for 9-17-169 that he needs to resign contract.

## 2018-04-24 ENCOUNTER — Other Ambulatory Visit: Payer: Self-pay | Admitting: Family

## 2018-04-24 DIAGNOSIS — R41840 Attention and concentration deficit: Secondary | ICD-10-CM

## 2018-04-25 NOTE — Telephone Encounter (Signed)
Requesting: Adderall 30mg  qday Contract: 2019  UDS: 10/25/17 Last OV: 10/25/17 Next Ov: 04/29/18 Last refill: 03/26/18, #30, 0RF Database: no discrepancies found  Please advise.

## 2018-04-27 MED ORDER — AMPHETAMINE-DEXTROAMPHET ER 30 MG PO CP24: 30.0000 mg | ORAL_CAPSULE | ORAL | 0 refills | Status: DC

## 2018-04-28 MED FILL — DEXTROAMP-AMPHET ER 30 MG C: 30 | 30 days supply | Qty: 30 | Fill #0

## 2018-04-28 MED FILL — CLOMIPHENE CITRATE 50 MG TA: 50 | 30 days supply | Qty: 15 | Fill #11

## 2018-04-29 ENCOUNTER — Encounter: Payer: Self-pay | Admitting: Family

## 2018-04-29 ENCOUNTER — Ambulatory Visit (INDEPENDENT_AMBULATORY_CARE_PROVIDER_SITE_OTHER): Payer: BLUE CROSS/BLUE SHIELD | Admitting: Family

## 2018-04-29 VITALS — BP 130/82 | HR 72 | Temp 99.0°F | Resp 16 | Ht 70.0 in | Wt 199.2 lb

## 2018-04-29 DIAGNOSIS — Z0000 Encounter for general adult medical examination without abnormal findings: Secondary | ICD-10-CM | POA: Diagnosis not present

## 2018-04-29 DIAGNOSIS — L989 Disorder of the skin and subcutaneous tissue, unspecified: Secondary | ICD-10-CM | POA: Diagnosis not present

## 2018-04-29 DIAGNOSIS — F909 Attention-deficit hyperactivity disorder, unspecified type: Secondary | ICD-10-CM

## 2018-04-29 NOTE — Progress Notes (Signed)
Subjective:    Patient ID: Zachary [name], male    DOB: 1975/11/11, 42 y.o.   MRN: [id number]  HPI  Patient presents today for complete physical.  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 Vision: due  Wt Readings from Last 3 Encounters:  04/29/18 199 lb 3.2 oz (90.4 kg)  10/25/17 196 lb 6.4 oz (89.1 kg)  04/26/17 190 lb 6.4 oz (86.4 kg)   Requests evaluation of skin lesion left side of nose.    Review of Systems  Constitutional: Negative for unexpected weight change.  HENT: Negative for hearing loss and rhinorrhea.   Eyes: Negative for visual disturbance.  Respiratory: Negative for cough.   Cardiovascular: Negative for leg swelling.  Gastrointestinal: Negative for constipation and diarrhea.  Genitourinary: Negative for dysuria and frequency.  Musculoskeletal: Negative for arthralgias and myalgias.  Skin: Negative for rash.  Neurological: Negative for headaches.  Hematological: Negative for adenopathy.  Psychiatric/Behavioral:       Denies depression/anxiety   Past Medical History:  Diagnosis Date  . ADD (attention deficit disorder)   . History of papillary adenocarcinoma of thyroid    s/p thyroidectomy 06/2003  . History of tobacco abuse   . Iatrogenic hyperthyroidism      Social History   Socioeconomic History  . Marital status: Married    Spouse name: Not on file  . Number of children: Not on file  . Years of education: Not on file  . Highest education level: Not on file  Occupational History  . Not on file  Social Needs  . Financial resource strain: Not on file  . Food insecurity:    Worry: Not on file    Inability: Not on file  . Transportation needs:    Medical: Not on file    Non-medical: Not on file  Tobacco Use  . Smoking status: Former Research scientist (life sciences)  . Smokeless tobacco: Never Used  . Tobacco comment: Quit  about 7 years ago. 3-5 pack year  history  Substance and Sexual Activity  . Alcohol use: Yes    Comment: 2-3 drinks per week  . Drug use: No  . Sexual activity: Not on file  Lifestyle  . Physical activity:    Days per week: Not on file    Minutes per session: Not on file  . Stress: Not on file  Relationships  . Social connections:    Talks on phone: Not on file    Gets together: Not on file    Attends religious service: Not on file    Active member of club or organization: Not on file    Attends meetings of clubs or organizations: Not on file    Relationship status: Not on file  . Intimate partner violence:    Fear of current or ex partner: Not on file    Emotionally abused: Not on file    Physically abused: Not on file    Forced sexual activity: Not on file  Other Topics Concern  . 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 THYROIDECTOMY  06/2003  . TONSILLECTOMY  1985    Family History  Problem Relation Age of Onset  . Achalasia Father 31  . Other Mother 20       healthy  . Hypertension Mother   . Coronary artery disease Paternal Grandmother   . Stroke Maternal Grandfather   . Diabetes Neg Hx     No Known Allergies  Current Outpatient Medications on File Prior to Visit  Medication Sig Dispense Refill  . amphetamine-dextroamphetamine (ADDERALL XR) 30 MG 24 hr capsule Take 1 capsule (30 mg total) by mouth every morning. 30 capsule 0  . CIALIS 5 MG tablet Take 2.5 mg by mouth daily.    . clomiPHENE (CLOMID) 50 MG tablet Take 25 mg by mouth daily.    Marland Kitchen levothyroxine (SYNTHROID, LEVOTHROID) 200 MCG tablet Take 1 tablet by mouth daily.     No current facility-administered medications on file prior to visit.      BP 130/82 (BP Location: Left Arm, Patient Position: Sitting, Cuff Size: Small)   Pulse 72   Temp 99 F (37.2 C) (Oral)   Resp 16   Ht 5\' 10"  (1.778 m)   Wt 199 lb 3.2 oz (90.4 kg)   SpO2 97%   BMI 28.58 kg/m       Objective:   Physical Exam  Physical Exam  Constitutional: He is oriented to person, place, and time. He appears well-developed and well-nourished. No distress.  HENT:  Head: Normocephalic and atraumatic.  Right Ear: Tympanic membrane and ear canal normal.  Left Ear: Tympanic membrane and ear canal normal.  Mouth/Throat: Oropharynx is clear and moist.  Eyes: Pupils are equal, round, and reactive to light. No scleral icterus.  Neck: Normal range of motion. No thyromegaly present.  Cardiovascular: Normal rate and regular rhythm.   No murmur heard. Pulmonary/Chest: Effort normal and breath sounds normal. No respiratory distress. He has no wheezes. He has no rales. He exhibits no tenderness.  Abdominal: Soft. Bowel sounds are normal. He exhibits no distension and no mass. There is no tenderness. There is no rebound and no guarding.  Musculoskeletal: He exhibits no edema.  Lymphadenopathy:    He has no cervical adenopathy.  Neurological: He is alert and oriented to person, place, and time. He has normal patellar reflexes. He exhibits normal muscle tone. Coordination normal.  Skin: Skin is warm and dry. irregular dark lesion left lateral nose Psychiatric: He has a normal mood and affect. His behavior is normal. Judgment and thought content normal.           Assessment & Plan:   Preventative care- discussed healthy diet, exercise, weight loss. Declines flu shot tetanus or flu shot. EKG tracing is personally reviewed.  EKG notes NSR.  No acute changes.   Skin lesion- refer to dermatology.   ADHD- reports stable on adderall, due for UDS.  Will order.     Assessment & Plan:

## 2018-04-29 NOTE — Patient Instructions (Addendum)
Please schedule a routine dental exam and eye exam.  Continue to work on healthy diet, exercise and weight loss. Complete lab work prior to leaving.

## 2018-04-30 LAB — CBC WITH DIFFERENTIAL/PLATELET
BASOS PCT: 0.6 %
Basophils Absolute: 38 cells/uL (ref 0–200)
Eosinophils Absolute: 198 cells/uL (ref 15–500)
Eosinophils Relative: 3.1 %
HEMATOCRIT: 47.5 % (ref 38.5–50.0)
HEMOGLOBIN: 16.6 g/dL (ref 13.2–17.1)
LYMPHS ABS: 1830 {cells}/uL (ref 850–3900)
MCH: 28.9 pg (ref 27.0–33.0)
MCHC: 34.9 g/dL (ref 32.0–36.0)
MCV: 82.6 fL (ref 80.0–100.0)
MPV: 10.4 fL (ref 7.5–12.5)
Monocytes Relative: 7.7 %
Neutro Abs: 3840 cells/uL (ref 1500–7800)
Neutrophils Relative %: 60 %
Platelets: 324 10*3/uL (ref 140–400)
RBC: 5.75 10*6/uL (ref 4.20–5.80)
RDW: 12.9 % (ref 11.0–15.0)
Total Lymphocyte: 28.6 %
WBC: 6.4 10*3/uL (ref 3.8–10.8)
WBCMIX: 493 {cells}/uL (ref 200–950)

## 2018-04-30 LAB — HEPATIC FUNCTION PANEL
AG RATIO: 2 (calc) (ref 1.0–2.5)
ALT: 32 U/L (ref 9–46)
AST: 19 U/L (ref 10–40)
Albumin: 4.7 g/dL (ref 3.6–5.1)
Alkaline phosphatase (APISO): 61 U/L (ref 40–115)
BILIRUBIN INDIRECT: 0.5 mg/dL (ref 0.2–1.2)
BILIRUBIN TOTAL: 0.6 mg/dL (ref 0.2–1.2)
Bilirubin, Direct: 0.1 mg/dL (ref 0.0–0.2)
Globulin: 2.3 g/dL (calc) (ref 1.9–3.7)
TOTAL PROTEIN: 7 g/dL (ref 6.1–8.1)

## 2018-04-30 LAB — URINALYSIS, ROUTINE W REFLEX MICROSCOPIC
BACTERIA UA: NONE SEEN /HPF
Bilirubin Urine: NEGATIVE
Glucose, UA: NEGATIVE
Hgb urine dipstick: NEGATIVE
Hyaline Cast: NONE SEEN /LPF
Ketones, ur: NEGATIVE
Leukocytes, UA: NEGATIVE
Nitrite: NEGATIVE
RBC / HPF: NONE SEEN /HPF (ref 0–2)
SPECIFIC GRAVITY, URINE: 1.012 (ref 1.001–1.03)
SQUAMOUS EPITHELIAL / LPF: NONE SEEN /HPF (ref <=5)
WBC, UA: NONE SEEN /HPF (ref 0–5)
pH: 7 (ref 5.0–8.0)

## 2018-04-30 LAB — LIPID PANEL
CHOLESTEROL: 193 mg/dL (ref <200)
HDL: 42 mg/dL (ref >40)
LDL CHOLESTEROL (CALC): 126 mg/dL — AB
Non-HDL Cholesterol (Calc): 151 mg/dL (calc) — ABNORMAL HIGH (ref <130)
TRIGLYCERIDES: 134 mg/dL (ref <150)
Total CHOL/HDL Ratio: 4.6 (calc) (ref <5.0)

## 2018-04-30 LAB — BASIC METABOLIC PANEL
BUN: 17 mg/dL (ref 7–25)
CHLORIDE: 102 mmol/L (ref 98–110)
CO2: 28 mmol/L (ref 20–32)
CREATININE: 1.08 mg/dL (ref 0.60–1.35)
Calcium: 10.1 mg/dL (ref 8.6–10.3)
Glucose, Bld: 97 mg/dL (ref 65–99)
POTASSIUM: 4.7 mmol/L (ref 3.5–5.3)
SODIUM: 138 mmol/L (ref 135–146)

## 2018-04-30 LAB — TSH: TSH: 0.39 mIU/L — ABNORMAL LOW (ref 0.40–4.50)

## 2018-05-01 ENCOUNTER — Other Ambulatory Visit: Payer: Self-pay

## 2018-05-01 DIAGNOSIS — R809 Proteinuria, unspecified: Secondary | ICD-10-CM

## 2018-05-01 NOTE — Progress Notes (Unsigned)
microsto

## 2018-05-02 DIAGNOSIS — D2239 Melanocytic nevi of other parts of face: Secondary | ICD-10-CM | POA: Diagnosis not present

## 2018-05-02 LAB — PAIN MGMT, PROFILE 8 W/CONF, U
6 ACETYLMORPHINE: NEGATIVE ng/mL (ref <10)
AMPHETAMINE: 2577 ng/mL — AB (ref <250)
Alcohol Metabolites: NEGATIVE ng/mL (ref <500)
Amphetamines: POSITIVE ng/mL — AB (ref <500)
Benzodiazepines: NEGATIVE ng/mL (ref <100)
Buprenorphine, Urine: NEGATIVE ng/mL (ref <5)
CREATININE: 56.2 mg/dL
Cocaine Metabolite: NEGATIVE ng/mL (ref <150)
MDMA: NEGATIVE ng/mL (ref <500)
Marijuana Metabolite: NEGATIVE ng/mL (ref <20)
Methamphetamine: NEGATIVE ng/mL (ref <250)
OPIATES: NEGATIVE ng/mL (ref <100)
OXIDANT: NEGATIVE ug/mL (ref <200)
OXYCODONE: NEGATIVE ng/mL (ref <100)
PH: 6.66 (ref 4.5–9.0)

## 2018-05-13 ENCOUNTER — Other Ambulatory Visit: Payer: BLUE CROSS/BLUE SHIELD

## 2018-05-26 ENCOUNTER — Other Ambulatory Visit: Payer: Self-pay | Admitting: Family

## 2018-05-26 DIAGNOSIS — R41840 Attention and concentration deficit: Secondary | ICD-10-CM

## 2018-05-26 NOTE — Telephone Encounter (Signed)
Last office visit on 04/29/2018 Last refill on 04/27/2018  #30 no refills Last UDS on 04/29/2018---Moderate Last CSC on 10/25/2017

## 2018-05-27 MED ORDER — AMPHETAMINE-DEXTROAMPHET ER 30 MG PO CP24: 30.0000 mg | ORAL_CAPSULE | ORAL | 0 refills | Status: DC

## 2018-05-27 MED FILL — DEXTROAMP-AMPHET ER 30 MG C: 30 | 30 days supply | Qty: 30 | Fill #0

## 2018-06-13 DIAGNOSIS — E291 Testicular hypofunction: Secondary | ICD-10-CM | POA: Diagnosis not present

## 2018-06-13 DIAGNOSIS — N5201 Erectile dysfunction due to arterial insufficiency: Secondary | ICD-10-CM | POA: Diagnosis not present

## 2018-06-16 MED FILL — LEVOTHYROXINE 175 MCG TAB: 175 | 30 days supply | Qty: 30 | Fill #0

## 2018-06-27 ENCOUNTER — Other Ambulatory Visit: Payer: Self-pay | Admitting: Family

## 2018-06-27 DIAGNOSIS — R41840 Attention and concentration deficit: Secondary | ICD-10-CM

## 2018-06-27 MED ORDER — AMPHETAMINE-DEXTROAMPHET ER 30 MG PO CP24: 30.0000 mg | ORAL_CAPSULE | ORAL | 0 refills | Status: DC

## 2018-06-27 MED FILL — DEXTROAMP-AMPHET ER 30 MG C: 30 | 30 days supply | Qty: 30 | Fill #0

## 2018-07-29 ENCOUNTER — Other Ambulatory Visit: Payer: Self-pay | Admitting: Family

## 2018-07-29 DIAGNOSIS — R41840 Attention and concentration deficit: Secondary | ICD-10-CM

## 2018-07-31 ENCOUNTER — Other Ambulatory Visit: Payer: Self-pay | Admitting: Family

## 2018-07-31 DIAGNOSIS — R41840 Attention and concentration deficit: Secondary | ICD-10-CM

## 2018-07-31 NOTE — Telephone Encounter (Signed)
Last Adderall RX: 06/27/18, #30 x no refills Last OV: 04/29/18 Next OV: 10/28/18 UDS: 05/01/18 moderate risk CSC: 10/25/17 CSR: No discrepancies identified.  Please advise in PCP's absence?

## 2018-07-31 NOTE — Telephone Encounter (Signed)
Reviewed and refilled adderall while pcp out of office.

## 2018-08-01 MED ORDER — AMPHETAMINE-DEXTROAMPHET ER 30 MG PO CP24: 30.0000 mg | ORAL_CAPSULE | ORAL | 0 refills | Status: DC

## 2018-08-01 MED FILL — DEXTROAMP-AMPHET ER 30 MG C: 30 | 30 days supply | Qty: 30 | Fill #0

## 2018-08-07 ENCOUNTER — Telehealth: Payer: Self-pay | Admitting: Family

## 2018-08-07 DIAGNOSIS — R809 Proteinuria, unspecified: Secondary | ICD-10-CM

## 2018-08-07 NOTE — Telephone Encounter (Signed)
Author phoned pt. To set up lab appointment, but no answer. Author left detailed VM asking for return call to schedule.

## 2018-08-07 NOTE — Telephone Encounter (Signed)
My records show that he needs to come back for repeat urinalysis with micro due to previous finding of some protein in his urine.  I have placed order. Could you please contact pt to schedule a lab visit?

## 2018-08-28 ENCOUNTER — Other Ambulatory Visit: Payer: Self-pay | Admitting: Family Medicine

## 2018-08-28 DIAGNOSIS — R41840 Attention and concentration deficit: Secondary | ICD-10-CM

## 2018-08-28 MED ORDER — AMPHETAMINE-DEXTROAMPHET ER 30 MG PO CP24: 30.0000 mg | ORAL_CAPSULE | ORAL | 0 refills | Status: DC

## 2018-08-28 MED FILL — AMPHETAMINE-DEXTROAMPHET ER: 30 | 30 days supply | Qty: 30 | Fill #0

## 2018-09-29 ENCOUNTER — Other Ambulatory Visit: Payer: Self-pay | Admitting: Family

## 2018-09-29 DIAGNOSIS — R41840 Attention and concentration deficit: Secondary | ICD-10-CM

## 2018-09-30 MED ORDER — AMPHETAMINE-DEXTROAMPHET ER 30 MG PO CP24: 30.0000 mg | ORAL_CAPSULE | ORAL | 0 refills | Status: DC

## 2018-09-30 MED FILL — AMPHETAMINE-DEXTROAMPHET ER: 30 | 30 days supply | Qty: 30 | Fill #0

## 2018-10-09 DIAGNOSIS — C73 Malignant neoplasm of thyroid gland: Secondary | ICD-10-CM | POA: Diagnosis not present

## 2018-10-09 DIAGNOSIS — E890 Postprocedural hypothyroidism: Secondary | ICD-10-CM | POA: Diagnosis not present

## 2018-10-28 ENCOUNTER — Ambulatory Visit: Payer: BLUE CROSS/BLUE SHIELD | Admitting: Family

## 2018-10-29 ENCOUNTER — Other Ambulatory Visit: Payer: Self-pay | Admitting: Family

## 2018-10-29 DIAGNOSIS — R41840 Attention and concentration deficit: Secondary | ICD-10-CM

## 2018-10-29 NOTE — Telephone Encounter (Signed)
Requesting: Adderall XR Contract: Yes UDS: Yes, moderate risk Last OV: 04/29/2018 Next OV: N/A Last Refill: 09/30/2018, #30--0 RF Database:   Please advise

## 2018-10-30 MED ORDER — AMPHETAMINE-DEXTROAMPHET ER 30 MG PO CP24: 30.0000 mg | ORAL_CAPSULE | ORAL | 0 refills | Status: DC

## 2018-10-30 MED FILL — AMPHETAMINE-DEXTROAMPHET ER: 30 | 30 days supply | Qty: 30 | Fill #0

## 2018-11-27 ENCOUNTER — Other Ambulatory Visit: Payer: Self-pay | Admitting: Family

## 2018-11-27 DIAGNOSIS — R41840 Attention and concentration deficit: Secondary | ICD-10-CM

## 2018-11-28 ENCOUNTER — Ambulatory Visit (INDEPENDENT_AMBULATORY_CARE_PROVIDER_SITE_OTHER): Payer: BLUE CROSS/BLUE SHIELD | Admitting: Family

## 2018-11-28 ENCOUNTER — Other Ambulatory Visit: Payer: Self-pay

## 2018-11-28 DIAGNOSIS — E291 Testicular hypofunction: Secondary | ICD-10-CM

## 2018-11-28 DIAGNOSIS — E039 Hypothyroidism, unspecified: Secondary | ICD-10-CM

## 2018-11-28 DIAGNOSIS — R41840 Attention and concentration deficit: Secondary | ICD-10-CM | POA: Diagnosis not present

## 2018-11-28 MED ORDER — AMPHETAMINE-DEXTROAMPHET ER 30 MG PO CP24: 30.0000 mg | ORAL_CAPSULE | ORAL | 0 refills | Status: DC

## 2018-11-28 NOTE — Telephone Encounter (Signed)
Appointment scheduled for today at 3:20 for virtual visit

## 2018-11-28 NOTE — Progress Notes (Signed)
Virtual Visit via Video Note  I connected Zachary [name] on 11/28/18 at  3:20 PM EDT by a video enabled telemedicine application and verified that I am speaking with the correct person using two identifiers. This visit type was conducted due to national recommendations for restrictions regarding the COVID-19 Pandemic (e.g. social distancing).  This format is felt to be most appropriate for this patient at this time.   I discussed the limitations of evaluation and management by telemedicine and the availability of in person appointments. The patient expressed understanding and agreed to proceed.  Only the patient and myself were on today's video visit. The patient was at home and I was at home at the time of today's visit.   We attempted to connect using a video application but the application failed and we transitioned to a telephone call instead.  History of Present Illness:  ADHD-reports symptoms are well controlled on adderall.    Hx of Thyroid cancer and hypothyroidism- Endocrinology- following with Dr. Posey Pronto. Reports that he feels well on current dose of synthroid and last tsh was wnl.    Low testosterone- maintained on clomid. Reports that this is being managed by [facility]   Observations/Objective:   Gen: Awake, alert, no acute distress Resp: Breathing sounds even and non-labored Psych: calm/pleasant demeanor Neuro: Alert and Oriented x 3,  speech is clear.   Assessment and Plan:  ADHD- stable on adderall. Refill provided. Will need UDS  In 3 months. Order placed as future.   Hypogonadism- maintained on clomid, clinically stable, management per urology.  Hypothyroid- clinically stable on synthroid. Management per endocrinology.   Follow Up Instructions:    I discussed the assessment and treatment plan with the patient. The patient was provided an opportunity to ask questions and all were answered. The patient agreed with the plan and demonstrated an understanding of  the instructions.   The patient was advised to call back or seek an in-person evaluation if the symptoms worsen or if the condition fails to improve as anticipated.    Nance Pear, NP

## 2018-12-29 ENCOUNTER — Other Ambulatory Visit: Payer: Self-pay | Admitting: Family

## 2018-12-29 DIAGNOSIS — R41840 Attention and concentration deficit: Secondary | ICD-10-CM

## 2018-12-29 MED ORDER — AMPHETAMINE-DEXTROAMPHET ER 30 MG PO CP24: 30.0000 mg | ORAL_CAPSULE | ORAL | 0 refills | Status: DC

## 2019-01-27 ENCOUNTER — Other Ambulatory Visit: Payer: Self-pay | Admitting: Family

## 2019-01-27 DIAGNOSIS — R41840 Attention and concentration deficit: Secondary | ICD-10-CM

## 2019-01-28 MED ORDER — AMPHETAMINE-DEXTROAMPHET ER 30 MG PO CP24: 30.0000 mg | ORAL_CAPSULE | ORAL | 0 refills | Status: DC

## 2019-02-24 ENCOUNTER — Other Ambulatory Visit: Payer: Self-pay | Admitting: Family

## 2019-02-24 DIAGNOSIS — R41840 Attention and concentration deficit: Secondary | ICD-10-CM

## 2019-02-25 MED ORDER — AMPHETAMINE-DEXTROAMPHET ER 30 MG PO CP24: 30.0000 mg | ORAL_CAPSULE | ORAL | 0 refills | Status: DC

## 2019-03-26 ENCOUNTER — Other Ambulatory Visit: Payer: Self-pay | Admitting: Family

## 2019-03-26 DIAGNOSIS — R41840 Attention and concentration deficit: Secondary | ICD-10-CM

## 2019-03-26 MED ORDER — AMPHETAMINE-DEXTROAMPHET ER 30 MG PO CP24: 30.0000 mg | ORAL_CAPSULE | ORAL | 0 refills | Status: DC

## 2019-04-28 ENCOUNTER — Other Ambulatory Visit: Payer: Self-pay | Admitting: Family

## 2019-04-28 DIAGNOSIS — R41840 Attention and concentration deficit: Secondary | ICD-10-CM

## 2019-04-30 MED ORDER — AMPHETAMINE-DEXTROAMPHET ER 30 MG PO CP24: 30.0000 mg | ORAL_CAPSULE | ORAL | 0 refills | Status: DC

## 2019-05-13 DIAGNOSIS — D2239 Melanocytic nevi of other parts of face: Secondary | ICD-10-CM | POA: Diagnosis not present

## 2019-05-27 ENCOUNTER — Other Ambulatory Visit: Payer: Self-pay | Admitting: Family

## 2019-05-27 ENCOUNTER — Telehealth: Payer: Self-pay | Admitting: Family

## 2019-05-27 ENCOUNTER — Ambulatory Visit (INDEPENDENT_AMBULATORY_CARE_PROVIDER_SITE_OTHER): Payer: BC Managed Care – PPO | Admitting: Family

## 2019-05-27 ENCOUNTER — Encounter: Payer: Self-pay | Admitting: Family

## 2019-05-27 DIAGNOSIS — Z0000 Encounter for general adult medical examination without abnormal findings: Secondary | ICD-10-CM

## 2019-05-27 DIAGNOSIS — Z85850 Personal history of malignant neoplasm of thyroid: Secondary | ICD-10-CM

## 2019-05-27 DIAGNOSIS — R41840 Attention and concentration deficit: Secondary | ICD-10-CM | POA: Diagnosis not present

## 2019-05-27 MED ORDER — AMPHETAMINE-DEXTROAMPHET ER 30 MG PO CP24: 30.0000 mg | ORAL_CAPSULE | ORAL | 0 refills | Status: DC

## 2019-05-27 NOTE — Telephone Encounter (Signed)
Please contact pt to schedule a lab appointment for labs and to sign controlled substance contract.

## 2019-05-27 NOTE — Progress Notes (Signed)
Virtual Visit via Video Note  I connected with Zachary [name] on 05/27/19 at 11:00 AM EDT by a video enabled telemedicine application and verified that I am speaking with the correct person using two identifiers.  Location: Patient: home Provider: home   I discussed the limitations of evaluation and management by telemedicine and the availability of in person appointments. The patient expressed understanding and agreed to proceed.  History of Present Illness:  Patient presents today for complete physical.  Immunizations: Tetanus 2017,  Flu shot is due- declines Diet: fair diet  Reports weight is up to about 205. He attributes this to being less active due to covid-19.  Wt Readings from Last 3 Encounters:  04/29/18 199 lb 3.2 oz (90.4 kg)  10/25/17 196 lb 6.4 oz (89.1 kg)  04/26/17 190 lb 6.4 oz (86.4 kg)  vision: last year Dental:  18 months.  Exercise: reports some exercise- plays softball with daughter. He also plays soccer- he league is starting back up in the next few weeks.   ADHD-  Reports that he is staying organized and completing his tasks.  He continues adderall.   Hx of thyroid cancer- following with endocrinology at [facility]. The last visit was back in February  ROS  + 6 pound weight gain ENT:  Denies cough or cold, denies hearing loss GI:  Denies N/V/D GU: dysuria/frequency Neuro:  frequent HA's Skin: Denies skin rash or moles that have changed Vision: denies difficulty with vision.  Past Medical History:  Diagnosis Date  . ADD (attention deficit disorder)   . History of papillary adenocarcinoma of thyroid    s/p thyroidectomy 06/2003  . History of tobacco abuse   . Iatrogenic hyperthyroidism      Social History   Socioeconomic History  . Marital status: Married    Spouse name: Not on file  . Number of children: Not on file  . Years of education: Not on file  . Highest education level: Not on file  Occupational History  . Not on file  Social  Needs  . Financial resource strain: Not on file  . Food insecurity    Worry: Not on file    Inability: Not on file  . Transportation needs    Medical: Not on file    Non-medical: Not on file  Tobacco Use  . Smoking status: Former Research scientist (life sciences)  . Smokeless tobacco: Never Used  . Tobacco comment: Quit  about 7 years ago. 3-5 pack year history  Substance and Sexual Activity  . Alcohol use: Yes    Comment: 2-3 drinks per week  . Drug use: No  . Sexual activity: Not on file  Lifestyle  . Physical activity    Days per week: Not on file    Minutes per session: Not on file  . Stress: Not on file  Relationships  . Social Herbalist on phone: Not on file    Gets together: Not on file    Attends religious service: Not on file    Active member of club or organization: Not on file    Attends meetings of clubs or organizations: Not on file    Relationship status: Not on file  . Intimate partner violence    Fear of current or ex partner: Not on file    Emotionally abused: Not on file    Physically abused: Not on file    Forced sexual activity: Not on file  Other Topics Concern  . 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 THYROIDECTOMY  06/2003  . TONSILLECTOMY  1985    Family History  Problem Relation Age of Onset  . Achalasia Father 74  . Other Mother 62       healthy  . Hypertension Mother   . Coronary artery disease Paternal Grandmother   . Stroke Maternal Grandfather   . Diabetes Neg Hx     No Known Allergies  Current Outpatient Medications on File Prior to Visit  Medication Sig Dispense Refill  . CIALIS 5 MG tablet Take 2.5 mg by mouth daily.    . clomiPHENE (CLOMID) 50  MG tablet Take 25 mg by mouth daily.    Marland Kitchen levothyroxine (SYNTHROID, LEVOTHROID) 200 MCG tablet Take 1 tablet by mouth daily.     No current facility-administered medications on file prior to visit.     There were no vitals taken for this visit.     Observations/Objective:   Gen: Awake, alert, no acute distress Resp: Breathing is even and non-labored Psych: calm/pleasant demeanor Neuro: Alert and Oriented x 3, + facial symmetry, speech is clear.   Assessment and Plan:  Preventative care- Discussed healthy diet, exercise and weight loss.  He is due for routine lab work. Will have staff schedule a lab appointment for patient.  ADHD- will order UDS and have him update his controlled substance contract when he comes for lab work.   Hx of thyroid cancer- maintained on synthroid which is being managed by endocrinology.    Follow Up Instructions:    I discussed the assessment and treatment plan with the patient. The patient was provided an opportunity to ask questions and all were answered. The patient agreed with the plan and demonstrated an understanding of the instructions.   The patient was advised to call back or seek an in-person evaluation if the symptoms worsen or if the condition fails to improve as anticipated.  Nance Pear, NP

## 2019-05-28 NOTE — Telephone Encounter (Signed)
DONE lab appointment for labs and to sign controlled substance contract.

## 2019-06-02 ENCOUNTER — Other Ambulatory Visit (INDEPENDENT_AMBULATORY_CARE_PROVIDER_SITE_OTHER): Payer: BC Managed Care – PPO

## 2019-06-02 ENCOUNTER — Other Ambulatory Visit: Payer: Self-pay

## 2019-06-02 DIAGNOSIS — Z0000 Encounter for general adult medical examination without abnormal findings: Secondary | ICD-10-CM

## 2019-06-02 DIAGNOSIS — R41840 Attention and concentration deficit: Secondary | ICD-10-CM | POA: Diagnosis not present

## 2019-06-02 LAB — HEPATIC FUNCTION PANEL
ALT: 28 U/L (ref 0–53)
AST: 16 U/L (ref 0–37)
Albumin: 4.7 g/dL (ref 3.5–5.2)
Alkaline Phosphatase: 65 U/L (ref 39–117)
Bilirubin, Direct: 0.1 mg/dL (ref 0.0–0.3)
Total Bilirubin: 0.5 mg/dL (ref 0.2–1.2)
Total Protein: 6.8 g/dL (ref 6.0–8.3)

## 2019-06-02 LAB — LIPID PANEL
Cholesterol: 192 mg/dL (ref 0–200)
HDL: 36.3 mg/dL — ABNORMAL LOW (ref >39.00)
NonHDL: 155.66
Total CHOL/HDL Ratio: 5
Triglycerides: 270 mg/dL — ABNORMAL HIGH (ref 0.0–149.0)
VLDL: 54 mg/dL — ABNORMAL HIGH (ref 0.0–40.0)

## 2019-06-02 LAB — BASIC METABOLIC PANEL
BUN: 12 mg/dL (ref 6–23)
CO2: 28 mEq/L (ref 19–32)
Calcium: 9.8 mg/dL (ref 8.4–10.5)
Chloride: 101 mEq/L (ref 96–112)
Creatinine, Ser: 1.09 mg/dL (ref 0.40–1.50)
GFR: 73.76 mL/min (ref >60.00)
Glucose, Bld: 104 mg/dL — ABNORMAL HIGH (ref 70–99)
Potassium: 4.5 mEq/L (ref 3.5–5.1)
Sodium: 137 mEq/L (ref 135–145)

## 2019-06-02 LAB — CBC WITH DIFFERENTIAL/PLATELET
Basophils Absolute: 0 10*3/uL (ref 0.0–0.1)
Basophils Relative: 0.5 % (ref 0.0–3.0)
Eosinophils Absolute: 0.4 10*3/uL (ref 0.0–0.7)
Eosinophils Relative: 4.2 % (ref 0.0–5.0)
HCT: 48.9 % (ref 39.0–52.0)
Hemoglobin: 16.9 g/dL (ref 13.0–17.0)
Lymphocytes Relative: 32.7 % (ref 12.0–46.0)
Lymphs Abs: 2.9 10*3/uL (ref 0.7–4.0)
MCHC: 34.6 g/dL (ref 30.0–36.0)
MCV: 85.1 fl (ref 78.0–100.0)
Monocytes Absolute: 0.5 10*3/uL (ref 0.1–1.0)
Monocytes Relative: 5.6 % (ref 3.0–12.0)
Neutro Abs: 5.1 10*3/uL (ref 1.4–7.7)
Neutrophils Relative %: 57 % (ref 43.0–77.0)
Platelets: 311 10*3/uL (ref 150.0–400.0)
RBC: 5.75 Mil/uL (ref 4.22–5.81)
RDW: 13.5 % (ref 11.5–15.5)
WBC: 8.9 10*3/uL (ref 4.0–10.5)

## 2019-06-02 LAB — LDL CHOLESTEROL, DIRECT: Direct LDL: 125 mg/dL

## 2019-06-03 ENCOUNTER — Encounter: Payer: Self-pay | Admitting: Family

## 2019-06-03 DIAGNOSIS — E781 Pure hyperglyceridemia: Secondary | ICD-10-CM

## 2019-06-03 HISTORY — DX: Pure hyperglyceridemia: E78.1

## 2019-06-06 LAB — PAIN MGMT, PROFILE 8 W/CONF, U
6 Acetylmorphine: NEGATIVE ng/mL
Alcohol Metabolites: NEGATIVE ng/mL (ref <500)
Amphetamine: 5761 ng/mL
Amphetamines: POSITIVE ng/mL
Benzodiazepines: NEGATIVE ng/mL
Buprenorphine, Urine: NEGATIVE ng/mL
Cocaine Metabolite: NEGATIVE ng/mL
Creatinine: 62.8 mg/dL
MDMA: NEGATIVE ng/mL
Marijuana Metabolite: NEGATIVE ng/mL
Methamphetamine: NEGATIVE ng/mL
Opiates: NEGATIVE ng/mL
Oxidant: NEGATIVE ug/mL
Oxycodone: NEGATIVE ng/mL
pH: 6.6 (ref 4.5–9.0)

## 2019-06-26 ENCOUNTER — Other Ambulatory Visit: Payer: Self-pay | Admitting: Family

## 2019-06-26 DIAGNOSIS — R41840 Attention and concentration deficit: Secondary | ICD-10-CM

## 2019-06-26 MED ORDER — AMPHETAMINE-DEXTROAMPHET ER 30 MG PO CP24: 30.0000 mg | ORAL_CAPSULE | ORAL | 0 refills | Status: DC

## 2019-07-28 ENCOUNTER — Other Ambulatory Visit: Payer: Self-pay | Admitting: Family

## 2019-07-28 DIAGNOSIS — R41840 Attention and concentration deficit: Secondary | ICD-10-CM

## 2019-07-31 ENCOUNTER — Encounter: Payer: Self-pay | Admitting: Family

## 2019-07-31 ENCOUNTER — Other Ambulatory Visit: Payer: Self-pay | Admitting: Family

## 2019-07-31 DIAGNOSIS — R41840 Attention and concentration deficit: Secondary | ICD-10-CM

## 2019-07-31 MED ORDER — AMPHETAMINE-DEXTROAMPHET ER 30 MG PO CP24: 30.0000 mg | ORAL_CAPSULE | ORAL | 0 refills | Status: DC

## 2019-07-31 NOTE — Telephone Encounter (Signed)
Requet sent to provider

## 2019-07-31 NOTE — Telephone Encounter (Signed)
Copied from [place] [id number]. Topic: Quick Communication - Rx Refill/Question >> Jul 31, 2019 12:23 PM Leward Quan A wrote: Medication: amphetamine-dextroamphetamine (ADDERALL XR) 30 MG 24 hr capsule   Patient requested refill on My Chart on 07/28/2019 need this medication today please   Has the patient contacted their pharmacy? Yes.   (Agent: If no, request that the patient contact the pharmacy for the refill.) (Agent: If yes, when and what did the pharmacy advise?)  Preferred Pharmacy (with phone number or street name): [id number] - [place], [address]  Phone:  [phone] Fax:  [phone]     Agent: Please be advised that RX refills may take up to 3 business days. We ask that you follow-up with your pharmacy.

## 2019-07-31 NOTE — Telephone Encounter (Signed)
See MyChart message from 07/28/2019 on this as well.  Pt wants to know when medication will be refilled as this is third day of request.

## 2019-07-31 NOTE — Telephone Encounter (Signed)
Requested medication (s) are due for refill today: yes  Requested medication (s) are on the active medication list: yes  Last refill:  06/26/2019  Future visit scheduled: no  Notes to clinic:  Patient needs refill today    Requested Prescriptions  Pending Prescriptions Disp Refills   amphetamine-dextroamphetamine (ADDERALL XR) 30 MG 24 hr capsule 30 capsule 0    Sig: Take 1 capsule (30 mg total) by mouth every morning.      Not Delegated - Psychiatry:  Stimulants/ADHD Failed - 07/31/2019 12:28 PM      Failed - This refill cannot be delegated      Passed - Urine Drug Screen completed in last 360 days.      Passed - Valid encounter within last 3 months    Recent Outpatient Visits           2 months ago Preventative health care   [facility] [place], NP   8 months ago Attention and concentration deficit   [facility] [place], NP   1 year ago Annual physical exam   [facility] [place], NP   1 year ago Attention deficit hyperactivity disorder (ADHD), unspecified ADHD type   [facility] [place], NP   2 years ago Routine general medical examination at a health care facility   [facility] [place], [place]

## 2019-08-04 DIAGNOSIS — F5221 Male erectile disorder: Secondary | ICD-10-CM | POA: Diagnosis not present

## 2019-08-04 DIAGNOSIS — E291 Testicular hypofunction: Secondary | ICD-10-CM | POA: Diagnosis not present

## 2019-08-26 ENCOUNTER — Other Ambulatory Visit: Payer: Self-pay | Admitting: Family

## 2019-08-26 DIAGNOSIS — R41840 Attention and concentration deficit: Secondary | ICD-10-CM

## 2019-08-26 NOTE — Telephone Encounter (Signed)
Requesting: Adderall XR Contract:  UDS: 06/06/2019 Last OV: 05/27/2019 Next OV: N/A Last Refill: 07/31/2019, #30--0 RF Database:   Please advise

## 2019-08-27 MED ORDER — AMPHETAMINE-DEXTROAMPHET ER 30 MG PO CP24: 30.0000 mg | ORAL_CAPSULE | ORAL | 0 refills | Status: DC

## 2019-09-29 ENCOUNTER — Other Ambulatory Visit: Payer: Self-pay | Admitting: Family

## 2019-09-29 DIAGNOSIS — R41840 Attention and concentration deficit: Secondary | ICD-10-CM

## 2019-09-29 MED ORDER — AMPHETAMINE-DEXTROAMPHET ER 30 MG PO CP24: 30.0000 mg | ORAL_CAPSULE | ORAL | 0 refills | Status: DC

## 2019-10-12 DIAGNOSIS — E890 Postprocedural hypothyroidism: Secondary | ICD-10-CM | POA: Diagnosis not present

## 2019-10-12 DIAGNOSIS — C73 Malignant neoplasm of thyroid gland: Secondary | ICD-10-CM | POA: Diagnosis not present

## 2019-10-15 MED FILL — LEVOTHYROXINE SODIUM 175 MC: 175 | 30 days supply | Qty: 30 | Fill #0

## 2019-10-26 DIAGNOSIS — E291 Testicular hypofunction: Secondary | ICD-10-CM | POA: Diagnosis not present

## 2019-10-26 DIAGNOSIS — R948 Abnormal results of function studies of other organs and systems: Secondary | ICD-10-CM | POA: Diagnosis not present

## 2019-10-27 ENCOUNTER — Other Ambulatory Visit: Payer: Self-pay | Admitting: Family

## 2019-10-27 DIAGNOSIS — R41840 Attention and concentration deficit: Secondary | ICD-10-CM

## 2019-10-28 MED ORDER — AMPHETAMINE-DEXTROAMPHET ER 30 MG PO CP24: 30.0000 mg | ORAL_CAPSULE | ORAL | 0 refills | Status: DC

## 2019-11-02 DIAGNOSIS — F5221 Male erectile disorder: Secondary | ICD-10-CM | POA: Diagnosis not present

## 2019-11-02 DIAGNOSIS — E291 Testicular hypofunction: Secondary | ICD-10-CM | POA: Diagnosis not present

## 2019-11-24 MED FILL — LEVOTHYROXINE SODIUM 175 MC: 175 | 30 days supply | Qty: 30 | Fill #1

## 2019-11-26 ENCOUNTER — Other Ambulatory Visit: Payer: Self-pay | Admitting: Family

## 2019-11-26 DIAGNOSIS — R41840 Attention and concentration deficit: Secondary | ICD-10-CM

## 2019-11-26 MED ORDER — AMPHETAMINE-DEXTROAMPHET ER 30 MG PO CP24: 30.0000 mg | ORAL_CAPSULE | ORAL | 0 refills | Status: DC

## 2019-11-26 NOTE — Telephone Encounter (Signed)
Requesting: adderall  Contract:3.26.21 UDS:10.20.20 Last Visit:10.14.20 Next Visit:n/a Last Refill:3.17.21  Please Advise

## 2019-11-26 NOTE — Telephone Encounter (Signed)
Adderall refill sent. Pt is due for follow up. Please contact pt to schedule.

## 2019-12-08 NOTE — Telephone Encounter (Signed)
Patient was scheduled for 12-15-19

## 2019-12-15 ENCOUNTER — Telehealth (INDEPENDENT_AMBULATORY_CARE_PROVIDER_SITE_OTHER): Payer: BC Managed Care – PPO | Admitting: Family

## 2019-12-15 ENCOUNTER — Other Ambulatory Visit: Payer: Self-pay

## 2019-12-15 DIAGNOSIS — F988 Other specified behavioral and emotional disorders with onset usually occurring in childhood and adolescence: Secondary | ICD-10-CM | POA: Diagnosis not present

## 2019-12-15 NOTE — Progress Notes (Signed)
Virtual Visit via Video Note  I connected with Zachary [name] on 12/15/19 at  5:20 PM EDT by a video enabled telemedicine application and verified that I am speaking with the correct person using two identifiers.  Location: Patient: home Provider: work   I discussed the limitations of evaluation and management by telemedicine and the availability of in person appointments. The patient expressed understanding and agreed to proceed.  History of Present Illness:  Patient is a 44 yr old male who presents today for follow up   ADHD- maintained on adderall. Working from home. Able to concentrate.  Feels less distracted at home than when he was in the office.  States he is not comfortable receiving the covid-19 vaccine.    Hx of thyroid cancer. Sees Endo for thyroid- reports synthroid dose is 153mcg grams.   Clomid and cialis- urology provides management of these medications.   Observations/Objective:   Gen: Awake, alert, no acute distress Resp: Breathing is even and non-labored Psych: calm/pleasant demeanor Neuro: Alert and Oriented x 3, + facial symmetry, speech is clear.   Assessment and Plan:  ADHD- stable on current dose of Adderall.  Continue same. UDS and contract are up to date.   Pt was counseled on the COVID-19 vaccine.  I recommended for him to take the vaccine.  Follow Up Instructions:    I discussed the assessment and treatment plan with the patient. The patient was provided an opportunity to ask questions and all were answered. The patient agreed with the plan and demonstrated an understanding of the instructions.   The patient was advised to call back or seek an in-person evaluation if the symptoms worsen or if the condition fails to improve as anticipated.  Nance Pear, NP

## 2019-12-17 ENCOUNTER — Encounter: Payer: Self-pay | Admitting: Family

## 2019-12-28 ENCOUNTER — Other Ambulatory Visit: Payer: Self-pay | Admitting: Family

## 2019-12-28 DIAGNOSIS — R41840 Attention and concentration deficit: Secondary | ICD-10-CM

## 2019-12-28 NOTE — Telephone Encounter (Signed)
Requesting:  Adderall Contract:  Signed on 10/23/2019 UDS:   06/06/2019 Last Visit:  05/27/2019 Next Visit: No Scheduled upcoming appointment Last Refill:   #30 no refills on 11/26/2019  Please Advise

## 2019-12-29 MED ORDER — AMPHETAMINE-DEXTROAMPHET ER 30 MG PO CP24: 30.0000 mg | ORAL_CAPSULE | ORAL | 0 refills | Status: DC

## 2020-01-25 MED FILL — LEVOTHYROXINE SODIUM 175 MC: 175 | 30 days supply | Qty: 30 | Fill #3

## 2020-01-26 ENCOUNTER — Other Ambulatory Visit: Payer: Self-pay | Admitting: Family

## 2020-01-26 DIAGNOSIS — R41840 Attention and concentration deficit: Secondary | ICD-10-CM

## 2020-01-26 NOTE — Telephone Encounter (Signed)
Requesting: adderall Contract:11/06/19 UDS:10/020/20 Last Visit:05/27/2019 Next Visit:N/A Last Refill:12/29/19  Please Advise

## 2020-01-27 DIAGNOSIS — F411 Generalized anxiety disorder: Secondary | ICD-10-CM | POA: Diagnosis not present

## 2020-01-28 MED ORDER — AMPHETAMINE-DEXTROAMPHET ER 30 MG PO CP24: 30.0000 mg | ORAL_CAPSULE | ORAL | 0 refills | Status: DC

## 2020-02-03 DIAGNOSIS — F411 Generalized anxiety disorder: Secondary | ICD-10-CM | POA: Diagnosis not present

## 2020-02-24 ENCOUNTER — Other Ambulatory Visit: Payer: Self-pay | Admitting: Family

## 2020-02-24 DIAGNOSIS — R41840 Attention and concentration deficit: Secondary | ICD-10-CM

## 2020-02-24 DIAGNOSIS — F411 Generalized anxiety disorder: Secondary | ICD-10-CM | POA: Diagnosis not present

## 2020-02-24 MED FILL — LEVOTHYROXINE SODIUM 175 MC: 175 | 30 days supply | Qty: 30 | Fill #4

## 2020-02-25 MED ORDER — AMPHETAMINE-DEXTROAMPHET ER 30 MG PO CP24: 30.0000 mg | ORAL_CAPSULE | ORAL | 0 refills | Status: DC

## 2020-03-02 DIAGNOSIS — F411 Generalized anxiety disorder: Secondary | ICD-10-CM | POA: Diagnosis not present

## 2020-03-07 DIAGNOSIS — F411 Generalized anxiety disorder: Secondary | ICD-10-CM | POA: Diagnosis not present

## 2020-03-09 DIAGNOSIS — F411 Generalized anxiety disorder: Secondary | ICD-10-CM | POA: Diagnosis not present

## 2020-03-16 DIAGNOSIS — F411 Generalized anxiety disorder: Secondary | ICD-10-CM | POA: Diagnosis not present

## 2020-03-23 DIAGNOSIS — F411 Generalized anxiety disorder: Secondary | ICD-10-CM | POA: Diagnosis not present

## 2020-03-28 ENCOUNTER — Other Ambulatory Visit: Payer: Self-pay | Admitting: Family

## 2020-03-28 DIAGNOSIS — R41840 Attention and concentration deficit: Secondary | ICD-10-CM

## 2020-03-28 NOTE — Telephone Encounter (Signed)
Requesting:adderall  Contract:11/06/19 UDS:06/02/19 Last Visit:12/15/19 Next Visit:n/a Last Refill:02/25/20  Please Advise

## 2020-03-29 MED ORDER — AMPHETAMINE-DEXTROAMPHET ER 30 MG PO CP24: 30.0000 mg | ORAL_CAPSULE | ORAL | 0 refills | Status: DC

## 2020-03-29 MED FILL — LEVOTHYROXINE SODIUM 175 MC: 175 | 30 days supply | Qty: 30 | Fill #5

## 2020-03-30 DIAGNOSIS — F411 Generalized anxiety disorder: Secondary | ICD-10-CM | POA: Diagnosis not present

## 2020-03-30 DIAGNOSIS — H1013 Acute atopic conjunctivitis, bilateral: Secondary | ICD-10-CM | POA: Diagnosis not present

## 2020-04-26 ENCOUNTER — Other Ambulatory Visit: Payer: Self-pay | Admitting: Family

## 2020-04-26 DIAGNOSIS — R41840 Attention and concentration deficit: Secondary | ICD-10-CM

## 2020-04-27 MED ORDER — AMPHETAMINE-DEXTROAMPHET ER 30 MG PO CP24: 30.0000 mg | ORAL_CAPSULE | ORAL | 0 refills | Status: DC

## 2020-04-27 NOTE — Telephone Encounter (Signed)
Requesting: adderall Contract:11/06/19 UDS:06/02/19 Last Visit:05/27/2019 Next Visit:n/a Last Refill:03/29/20  Please Advise

## 2020-05-03 DIAGNOSIS — E291 Testicular hypofunction: Secondary | ICD-10-CM | POA: Diagnosis not present

## 2020-05-03 DIAGNOSIS — R948 Abnormal results of function studies of other organs and systems: Secondary | ICD-10-CM | POA: Diagnosis not present

## 2020-05-03 DIAGNOSIS — F5221 Male erectile disorder: Secondary | ICD-10-CM | POA: Diagnosis not present

## 2020-05-26 ENCOUNTER — Other Ambulatory Visit: Payer: Self-pay | Admitting: Family

## 2020-05-26 DIAGNOSIS — R41840 Attention and concentration deficit: Secondary | ICD-10-CM

## 2020-05-26 NOTE — Telephone Encounter (Signed)
Requesting:Adderall  Contract:10/23/2019 UDS:06/02/2019 Last Visit:12/15/2019 Next Visit:none scheduled Last Refill:04/27/2020  Please Advise

## 2020-05-29 MED ORDER — AMPHETAMINE-DEXTROAMPHET ER 30 MG PO CP24: 30.0000 mg | ORAL_CAPSULE | ORAL | 0 refills | Status: DC

## 2020-06-24 ENCOUNTER — Other Ambulatory Visit: Payer: Self-pay | Admitting: Family

## 2020-06-24 DIAGNOSIS — R41840 Attention and concentration deficit: Secondary | ICD-10-CM

## 2020-06-24 MED ORDER — AMPHETAMINE-DEXTROAMPHET ER 30 MG PO CP24: 30.0000 mg | ORAL_CAPSULE | ORAL | 0 refills | Status: DC

## 2020-06-24 NOTE — Telephone Encounter (Signed)
Last written:   Last ov: 12/15/19 Next ov: none Contract: 10/23/19 UDS: 06/02/19

## 2020-07-20 DIAGNOSIS — F411 Generalized anxiety disorder: Secondary | ICD-10-CM | POA: Diagnosis not present

## 2020-07-21 DIAGNOSIS — D2239 Melanocytic nevi of other parts of face: Secondary | ICD-10-CM | POA: Diagnosis not present

## 2020-07-26 ENCOUNTER — Other Ambulatory Visit: Payer: Self-pay | Admitting: Family

## 2020-07-26 DIAGNOSIS — R41840 Attention and concentration deficit: Secondary | ICD-10-CM

## 2020-07-27 ENCOUNTER — Telehealth: Payer: Self-pay | Admitting: Family

## 2020-07-27 DIAGNOSIS — F411 Generalized anxiety disorder: Secondary | ICD-10-CM | POA: Diagnosis not present

## 2020-07-27 MED ORDER — AMPHETAMINE-DEXTROAMPHET ER 30 MG PO CP24: 30.0000 mg | ORAL_CAPSULE | ORAL | 0 refills | Status: DC

## 2020-07-27 NOTE — Telephone Encounter (Signed)
See mychart.  

## 2020-07-27 NOTE — Telephone Encounter (Signed)
Requesting: adderall Contract:11/06/19 UDS:06/24/20 Last Visit:12/15/19 Next Visit:n/a Last Refill:06/24/20  Please Advise

## 2020-08-02 DIAGNOSIS — F411 Generalized anxiety disorder: Secondary | ICD-10-CM | POA: Diagnosis not present

## 2020-08-10 DIAGNOSIS — F411 Generalized anxiety disorder: Secondary | ICD-10-CM | POA: Diagnosis not present

## 2020-08-15 DIAGNOSIS — F411 Generalized anxiety disorder: Secondary | ICD-10-CM | POA: Diagnosis not present

## 2020-08-24 ENCOUNTER — Other Ambulatory Visit: Payer: Self-pay | Admitting: Family

## 2020-08-24 DIAGNOSIS — F411 Generalized anxiety disorder: Secondary | ICD-10-CM | POA: Diagnosis not present

## 2020-08-24 DIAGNOSIS — R41840 Attention and concentration deficit: Secondary | ICD-10-CM

## 2020-08-24 MED ORDER — AMPHETAMINE-DEXTROAMPHET ER 30 MG PO CP24: 30.0000 mg | ORAL_CAPSULE | ORAL | 0 refills | Status: DC

## 2020-08-24 NOTE — Telephone Encounter (Signed)
Requesting: adderall Contract: 10/23/19 UDS: 06/02/19 Last Visit: 12/15/19 Next Visit: n/a Last Refill: 07/27/20  Please Advise

## 2020-08-30 ENCOUNTER — Encounter: Payer: Self-pay | Admitting: Family

## 2020-08-30 ENCOUNTER — Telehealth (INDEPENDENT_AMBULATORY_CARE_PROVIDER_SITE_OTHER): Payer: BC Managed Care – PPO | Admitting: Family

## 2020-08-30 VITALS — Wt 205.0 lb

## 2020-08-30 DIAGNOSIS — Z7185 Encounter for immunization safety counseling: Secondary | ICD-10-CM

## 2020-08-30 DIAGNOSIS — Z85850 Personal history of malignant neoplasm of thyroid: Secondary | ICD-10-CM

## 2020-08-30 DIAGNOSIS — F909 Attention-deficit hyperactivity disorder, unspecified type: Secondary | ICD-10-CM | POA: Diagnosis not present

## 2020-08-30 NOTE — Telephone Encounter (Signed)
Rod Holler, I printed out a copy of his controlled substance contract.   Can you please leave it at the front desk for him to sign when he comes next week for his lab visit?

## 2020-08-30 NOTE — Progress Notes (Addendum)
Virtual Visit via Video Note  I connected with Zachary [name] on 08/30/20 at  9:40 AM EST by a video enabled telemedicine application and verified that I am speaking with the correct person using two identifiers.  Location: Patient: work Provider: home   I discussed the limitations of evaluation and management by telemedicine and the availability of in person appointments. The patient expressed understanding and agreed to proceed. Only the patient and myself were present for today's video call.   History of Present Illness:  ADHD- he is currently maintained on adderall xr 30mg  once daily. States that it generally wears off around 2pm in the afternoon.  He generally takes his AM dose at 5:30-6 AM in the morning. He denies any side effects from the adderall.   Thyroid cancer- he continues to follow with Endocrinology and is maintained on synthroid.    Observations/Objective:   Gen: Awake, alert, no acute distress Resp: Breathing is even and non-labored Psych: calm/pleasant demeanor Neuro: Alert and Oriented x 3, + facial symmetry, speech is clear.   Assessment and Plan:  ADHD- overall controlled.  I advised the patient to try to take his adderall around 6:30 AM in the morning to improve his afternoon coverage. He will return to the office to update his controlled substance contract and UDS next week.   Immunization counseling- He has not yet received the covid-19 vaccination. He cited concern that it is not FDA approved.  I advised him that the [place] vaccine does have full FDA approval and that COVID vaccinations have been shown to decrease risk of severe illness/hospitalization and death from [place].  Hx of thyroid CA- he plans to follow up with Endo in March for his annual visit.  Advised pt to follow up in 3 months for annual physical.   Follow Up Instructions:    I discussed the assessment and treatment plan with the patient. The patient was provided an opportunity to ask  questions and all were answered. The patient agreed with the plan and demonstrated an understanding of the instructions.   The patient was advised to call back or seek an in-person evaluation if the symptoms worsen or if the condition fails to improve as anticipated.  Nance Pear, NP

## 2020-08-31 DIAGNOSIS — F411 Generalized anxiety disorder: Secondary | ICD-10-CM | POA: Diagnosis not present

## 2020-08-31 NOTE — Telephone Encounter (Signed)
Controlled substance contract left upfront for patient to signed when he comes in for visit 09-05-20

## 2020-09-05 ENCOUNTER — Other Ambulatory Visit: Payer: BC Managed Care – PPO

## 2020-09-05 ENCOUNTER — Other Ambulatory Visit: Payer: Self-pay

## 2020-09-05 DIAGNOSIS — F909 Attention-deficit hyperactivity disorder, unspecified type: Secondary | ICD-10-CM

## 2020-09-07 DIAGNOSIS — F411 Generalized anxiety disorder: Secondary | ICD-10-CM | POA: Diagnosis not present

## 2020-09-07 LAB — DRUG MONITORING, PANEL 8 WITH CONFIRMATION, URINE
6 Acetylmorphine: NEGATIVE ng/mL (ref <10)
Alcohol Metabolites: NEGATIVE ng/mL
Amphetamine: 7270 ng/mL — ABNORMAL HIGH (ref <250)
Amphetamines: POSITIVE ng/mL — AB (ref <500)
Benzodiazepines: NEGATIVE ng/mL (ref <100)
Buprenorphine, Urine: NEGATIVE ng/mL (ref <5)
Cocaine Metabolite: NEGATIVE ng/mL (ref <150)
Creatinine: 211.9 mg/dL
MDMA: NEGATIVE ng/mL (ref <500)
Marijuana Metabolite: NEGATIVE ng/mL (ref <20)
Methamphetamine: NEGATIVE ng/mL (ref <250)
Opiates: NEGATIVE ng/mL (ref <100)
Oxidant: NEGATIVE ug/mL
Oxycodone: NEGATIVE ng/mL (ref <100)
pH: 6.7 (ref 4.5–9.0)

## 2020-09-07 LAB — DM TEMPLATE

## 2020-09-14 DIAGNOSIS — F411 Generalized anxiety disorder: Secondary | ICD-10-CM | POA: Diagnosis not present

## 2020-09-19 DIAGNOSIS — F411 Generalized anxiety disorder: Secondary | ICD-10-CM | POA: Diagnosis not present

## 2020-09-25 ENCOUNTER — Other Ambulatory Visit: Payer: Self-pay | Admitting: Family

## 2020-09-25 DIAGNOSIS — R41840 Attention and concentration deficit: Secondary | ICD-10-CM

## 2020-09-26 MED ORDER — AMPHETAMINE-DEXTROAMPHET ER 30 MG PO CP24: 30.0000 mg | ORAL_CAPSULE | ORAL | 0 refills | Status: DC

## 2020-09-26 NOTE — Telephone Encounter (Signed)
Requesting: Adderall XR Contract: 09/05/20 UDS:09/05/20 Last Visit: 08/30/20 Next Visit: none Last Refill: 08/24/20  Please Advise

## 2020-09-28 DIAGNOSIS — F411 Generalized anxiety disorder: Secondary | ICD-10-CM | POA: Diagnosis not present

## 2020-10-05 DIAGNOSIS — F411 Generalized anxiety disorder: Secondary | ICD-10-CM | POA: Diagnosis not present

## 2020-10-11 DIAGNOSIS — E890 Postprocedural hypothyroidism: Secondary | ICD-10-CM | POA: Diagnosis not present

## 2020-10-11 DIAGNOSIS — C73 Malignant neoplasm of thyroid gland: Secondary | ICD-10-CM | POA: Diagnosis not present

## 2020-10-12 DIAGNOSIS — F411 Generalized anxiety disorder: Secondary | ICD-10-CM | POA: Diagnosis not present

## 2020-10-19 DIAGNOSIS — F411 Generalized anxiety disorder: Secondary | ICD-10-CM | POA: Diagnosis not present

## 2020-10-24 ENCOUNTER — Other Ambulatory Visit: Payer: Self-pay | Admitting: Family

## 2020-10-24 DIAGNOSIS — R41840 Attention and concentration deficit: Secondary | ICD-10-CM

## 2020-10-25 MED ORDER — AMPHETAMINE-DEXTROAMPHET ER 30 MG PO CP24
30.0000 mg | ORAL_CAPSULE | ORAL | 0 refills | Status: DC
Start: 2020-10-25 — End: 2020-11-22

## 2020-10-25 NOTE — Telephone Encounter (Signed)
Requesting: Adderall Contract:  09/05/20 UDS: 09/05/20 Last Visit: 08/30/20 Next Visit: none Last Refill: 09/27/19  Please Advise

## 2020-10-26 DIAGNOSIS — F411 Generalized anxiety disorder: Secondary | ICD-10-CM | POA: Diagnosis not present

## 2020-11-02 DIAGNOSIS — F411 Generalized anxiety disorder: Secondary | ICD-10-CM | POA: Diagnosis not present

## 2020-11-09 DIAGNOSIS — F411 Generalized anxiety disorder: Secondary | ICD-10-CM | POA: Diagnosis not present

## 2020-11-16 DIAGNOSIS — F411 Generalized anxiety disorder: Secondary | ICD-10-CM | POA: Diagnosis not present

## 2020-11-22 ENCOUNTER — Other Ambulatory Visit: Payer: Self-pay | Admitting: Family

## 2020-11-22 DIAGNOSIS — R41840 Attention and concentration deficit: Secondary | ICD-10-CM

## 2020-11-23 DIAGNOSIS — F411 Generalized anxiety disorder: Secondary | ICD-10-CM | POA: Diagnosis not present

## 2020-11-23 MED ORDER — AMPHETAMINE-DEXTROAMPHET ER 30 MG PO CP24
30.0000 mg | ORAL_CAPSULE | ORAL | 0 refills | Status: DC
Start: 2020-11-23 — End: 2020-12-22

## 2020-11-23 NOTE — Telephone Encounter (Signed)
Requesting:adderall xr 30mg  Contract:09/05/20 UDS: 09/05/20 Last Visit:08/30/20 Next Visit: unknown Last Refill:10/25/20  Please Advise

## 2020-11-23 NOTE — Telephone Encounter (Signed)
Refilled patient's Adderall XR today.  Patient's PCP is not in the office and covering her basket.

## 2020-11-27 DIAGNOSIS — F411 Generalized anxiety disorder: Secondary | ICD-10-CM | POA: Diagnosis not present

## 2020-12-07 DIAGNOSIS — F411 Generalized anxiety disorder: Secondary | ICD-10-CM | POA: Diagnosis not present

## 2020-12-21 DIAGNOSIS — F411 Generalized anxiety disorder: Secondary | ICD-10-CM | POA: Diagnosis not present

## 2020-12-22 ENCOUNTER — Other Ambulatory Visit: Payer: Self-pay | Admitting: Medical

## 2020-12-22 DIAGNOSIS — R41840 Attention and concentration deficit: Secondary | ICD-10-CM

## 2020-12-22 MED ORDER — AMPHETAMINE-DEXTROAMPHET ER 30 MG PO CP24: 30.0000 mg | ORAL_CAPSULE | ORAL | 0 refills | Status: DC

## 2020-12-22 NOTE — Telephone Encounter (Signed)
Requesting: adderall Contract:[id number] UDS:01/242/22022 Last Visit:08/30/2020 Next Visit:n/a Last Refill:11/23/2020  Please Advise

## 2020-12-28 DIAGNOSIS — F411 Generalized anxiety disorder: Secondary | ICD-10-CM | POA: Diagnosis not present

## 2021-01-23 ENCOUNTER — Other Ambulatory Visit: Payer: Self-pay | Admitting: Family

## 2021-01-23 DIAGNOSIS — R41840 Attention and concentration deficit: Secondary | ICD-10-CM

## 2021-01-23 NOTE — Telephone Encounter (Signed)
Requesting: adderall xr 30mg    Contract: 09/05/20  UDS: 09/05/20 Last Visit: 08/30/20 Next Visit: n/a Last Refill: 12/22/20  Please Advise

## 2021-01-24 MED ORDER — AMPHETAMINE-DEXTROAMPHET ER 30 MG PO CP24: 30.0000 mg | ORAL_CAPSULE | ORAL | 0 refills | Status: DC

## 2021-02-01 DIAGNOSIS — F411 Generalized anxiety disorder: Secondary | ICD-10-CM | POA: Diagnosis not present

## 2021-02-08 DIAGNOSIS — F411 Generalized anxiety disorder: Secondary | ICD-10-CM | POA: Diagnosis not present

## 2021-02-23 ENCOUNTER — Telehealth: Payer: Self-pay | Admitting: Family

## 2021-02-23 ENCOUNTER — Other Ambulatory Visit: Payer: Self-pay | Admitting: Family

## 2021-02-23 DIAGNOSIS — R41840 Attention and concentration deficit: Secondary | ICD-10-CM

## 2021-02-23 MED ORDER — AMPHETAMINE-DEXTROAMPHET ER 30 MG PO CP24: 30.0000 mg | ORAL_CAPSULE | ORAL | 0 refills | Status: DC

## 2021-02-23 NOTE — Telephone Encounter (Signed)
Requesting: Adderall XR 30mg  Contract: 05/27/2019 UDS: 09/05/2020 Last Visit: 08/30/2020 Next Visit: None Last Refill: 01/24/2021 #30 and 0RF  Please Advise

## 2021-02-23 NOTE — Telephone Encounter (Signed)
Please contact pt to schedule a follow up appointment.  °

## 2021-02-24 NOTE — Telephone Encounter (Signed)
Letter sent to My Chart

## 2021-02-26 NOTE — Telephone Encounter (Signed)
Can you please also send a snail mail letter? Tks

## 2021-02-27 NOTE — Telephone Encounter (Signed)
Appointment made for 04/11/2021

## 2021-02-27 NOTE — Telephone Encounter (Signed)
No letter in chart , called pt and lvm to return call

## 2021-03-01 DIAGNOSIS — F411 Generalized anxiety disorder: Secondary | ICD-10-CM | POA: Diagnosis not present

## 2021-03-08 DIAGNOSIS — F411 Generalized anxiety disorder: Secondary | ICD-10-CM | POA: Diagnosis not present

## 2021-03-15 DIAGNOSIS — F411 Generalized anxiety disorder: Secondary | ICD-10-CM | POA: Diagnosis not present

## 2021-03-22 ENCOUNTER — Other Ambulatory Visit: Payer: Self-pay | Admitting: Family

## 2021-03-22 DIAGNOSIS — R41840 Attention and concentration deficit: Secondary | ICD-10-CM

## 2021-03-22 DIAGNOSIS — F411 Generalized anxiety disorder: Secondary | ICD-10-CM | POA: Diagnosis not present

## 2021-03-23 NOTE — Telephone Encounter (Signed)
Requesting: adderall xr  Contract: 09/05/20 UDS: 09/05/20 Last Visit: 08/30/20 Next Visit: 04/11/21  Last Refill: 02/23/21  Please Advise

## 2021-03-24 MED ORDER — AMPHETAMINE-DEXTROAMPHET ER 30 MG PO CP24: 30.0000 mg | ORAL_CAPSULE | ORAL | 0 refills | Status: DC

## 2021-03-29 DIAGNOSIS — F411 Generalized anxiety disorder: Secondary | ICD-10-CM | POA: Diagnosis not present

## 2021-04-11 ENCOUNTER — Encounter: Payer: BC Managed Care – PPO | Admitting: Family

## 2021-04-20 DIAGNOSIS — F411 Generalized anxiety disorder: Secondary | ICD-10-CM | POA: Diagnosis not present

## 2021-04-25 ENCOUNTER — Other Ambulatory Visit: Payer: Self-pay | Admitting: Family

## 2021-04-25 DIAGNOSIS — R41840 Attention and concentration deficit: Secondary | ICD-10-CM

## 2021-04-25 NOTE — Telephone Encounter (Signed)
Requesting: adderall xr Contract: 09/05/20 UDS: 09/05/20 Last Visit: 08/30/20 Next Visit: none Last Refill: 03/24/21  Please Advise

## 2021-04-26 DIAGNOSIS — F411 Generalized anxiety disorder: Secondary | ICD-10-CM | POA: Diagnosis not present

## 2021-05-02 ENCOUNTER — Encounter: Payer: Self-pay | Admitting: Family

## 2021-05-02 ENCOUNTER — Ambulatory Visit (INDEPENDENT_AMBULATORY_CARE_PROVIDER_SITE_OTHER): Payer: BC Managed Care – PPO | Admitting: Family

## 2021-05-02 ENCOUNTER — Other Ambulatory Visit: Payer: Self-pay

## 2021-05-02 VITALS — BP 140/84 | HR 57 | Temp 98.2°F | Resp 16 | Ht 69.0 in | Wt 204.0 lb

## 2021-05-02 DIAGNOSIS — Z85850 Personal history of malignant neoplasm of thyroid: Secondary | ICD-10-CM

## 2021-05-02 DIAGNOSIS — E781 Pure hyperglyceridemia: Secondary | ICD-10-CM

## 2021-05-02 DIAGNOSIS — Z0000 Encounter for general adult medical examination without abnormal findings: Secondary | ICD-10-CM | POA: Diagnosis not present

## 2021-05-02 DIAGNOSIS — R739 Hyperglycemia, unspecified: Secondary | ICD-10-CM

## 2021-05-02 DIAGNOSIS — F988 Other specified behavioral and emotional disorders with onset usually occurring in childhood and adolescence: Secondary | ICD-10-CM

## 2021-05-02 DIAGNOSIS — E291 Testicular hypofunction: Secondary | ICD-10-CM

## 2021-05-02 DIAGNOSIS — F411 Generalized anxiety disorder: Secondary | ICD-10-CM | POA: Diagnosis not present

## 2021-05-02 MED ORDER — AMPHETAMINE-DEXTROAMPHET ER 20 MG PO CP24: 20.0000 mg | ORAL_CAPSULE | ORAL | 0 refills | Status: DC

## 2021-05-02 NOTE — Assessment & Plan Note (Signed)
Clinically stable. Being followed by endocrinology (Dr. Posey Pronto).

## 2021-05-02 NOTE — Patient Instructions (Addendum)
Please schedule routine dental exam. Try to cut back on sugar.  Decrease your adderall to 20mg  xr once daily.  Send me a note in a few weeks to let me know how you are doing on the lower dose of adderall. Complete lab work prior to leaving.

## 2021-05-02 NOTE — Assessment & Plan Note (Signed)
Stable. He would like to try decreasing his dose of adderall. Will decrease from 30mg  to 20mg  to see how he does.

## 2021-05-02 NOTE — Progress Notes (Signed)
Subjective:   By signing my name below, I, Zachary [name], attest that this documentation has been prepared under the direction and in the presence of Debbrah Alar, NP, 05/02/2021   Patient ID: Zachary [name], male    DOB: 08-Nov-1975, 45 y.o.   MRN: [id number]  Chief Complaint  Patient presents with   Annual Exam         HPI Patient is in today for a comprehensive physical exam.  He denies having any unexpected weight change, ear pain, hearing loss and rhinorrhea, visual disturbance, cough, chest pain and leg swelling, nausea, vomiting, diarrhea and blood in stool, or dysuria and frequency, for myalgias and arthralgias, rash, headaches, adenopathy, depression or anxiety at this time. ADD: He has been diagnosed with ADD and has been taking medication for the last 10 years to help with symptoms. He has stopped taking his medication the last couple of weeks and has considered cutting back or quitting the medication in all. He does note that without the medication work has been tougher for him and his focus has not been great but it was manageable. Medications: He currently takes 30 mg Adderall to help with his ADD symptoms. He would like to decrease his dosage to 20 mg. Immunizations: Tetanus was given in 2017 and he is UTD. He is not interested in receiving a Covid-19 vaccination or a flu shot.  Colonoscopy: Due to turning 45 this year he is due for his first colonoscopy.  Diet: He denies eating a healthy diet. He admits that sugar is the worst thing for his diet. 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  Tobacco usage: Does not use tobacco or drugs.  Alcohol usage: 2-3 drinks per week.  Vision: He is UTD on vision. Dental: He is not UTD on dental.  Shx: He has no changes in his surgical history FMHx: He denies any changes to his family medical history.   Health  Maintenance Due  Topic Date Due   HIV Screening  Never done   Hepatitis C Screening  Never done   COLONOSCOPY (Pts 45-28yr Insurance coverage will need to be confirmed)  Never done    Past Medical History:  Diagnosis Date   ADD (attention deficit disorder)    History of papillary adenocarcinoma of thyroid    s/p thyroidectomy 06/2003   History of tobacco abuse    Hypertriglyceridemia 06/03/2019   Iatrogenic hyperthyroidism     Past Surgical History:  Procedure Laterality Date   THYROIDECTOMY  06/2003   TONSILLECTOMY  1985    Family History  Problem Relation Age of Onset   Achalasia Father 614  Other Mother 577      healthy   Hypertension Mother    Coronary artery disease Paternal Grandmother    Stroke Maternal Grandfather    Diabetes Neg Hx     Social History   Socioeconomic History   Marital status: Married    Spouse name: Not on file   Number of children: Not on file   Years of education: Not on file   Highest education level: Not on file  Occupational History   Not on file  Tobacco Use   Smoking status: Former   Smokeless tobacco: Never   Tobacco comments:    Quit  about 7 years ago. 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     clomiPHENE (CLOMID) 50 MG tablet Take 25 mg by mouth daily.     levothyroxine (SYNTHROID, LEVOTHROID) 200 MCG tablet Take 1 tablet by mouth daily.     amphetamine-dextroamphetamine (ADDERALL XR) 30 MG 24 hr capsule Take 1 capsule (30 mg total) by mouth every morning. 30 capsule 0   No facility-administered medications prior to visit.    No Known Allergies  Review of Systems  Constitutional:  Negative for fever.       (-) unexplained weight changes  HENT:  Negative for ear pain and hearing loss.        (-) rhinorrhea  Eyes:        (+) Visual disturbances   Respiratory:  Negative for cough and shortness of breath.   Cardiovascular:  Negative for chest pain.  Gastrointestinal:  Negative for blood in stool, constipation, diarrhea, nausea and vomiting.  Genitourinary:  Negative for dysuria and frequency.  Musculoskeletal:  Negative for joint pain and myalgias.  Skin:  Negative for rash.  Neurological:  Negative for headaches.  Psychiatric/Behavioral:  Negative for depression. The patient is not nervous/anxious.       Objective:    Physical Exam Constitutional:      General: He is not in acute distress.    Appearance: Normal appearance. He is not ill-appearing.  HENT:     Head: Normocephalic and atraumatic.     Right Ear: Tympanic membrane, ear canal and external ear normal.     Left Ear: Tympanic membrane, ear canal and external ear normal.  Eyes:     Extraocular Movements: Extraocular movements intact.     Pupils: Pupils are equal, round, and reactive to light.     Comments: (-) nystagmus  Cardiovascular:     Rate and Rhythm: Normal rate and regular rhythm.     Heart sounds: Normal heart sounds. No murmur heard.   No gallop.  Pulmonary:     Effort: Pulmonary effort is normal. No respiratory distress.     Breath sounds: Normal breath sounds. No wheezing or rales.  Musculoskeletal:      Comments: (+) 5/5 upper and lower extremity strength  Lymphadenopathy:     Cervical: No cervical adenopathy.  Skin:    General: Skin is warm and dry.     Capillary Refill: Capillary refill takes more than 3 seconds.  Neurological:     Mental Status: He is alert and oriented to person, place, and time.     Deep Tendon Reflexes:  Reflex Scores:      Patellar reflexes are 2+ on the right side and 2+ on the left side. Psychiatric:        Behavior: Behavior normal.        Judgment: Judgment normal.    BP 140/84 (BP Location: Right Arm, Patient Position: Sitting, Cuff Size: Small)   Pulse (!) 57   Temp 98.2 F (36.8 C) (Oral)   Resp 16   Ht 5' 9"  (1.753 m)   Wt 204 lb (92.5 kg)   SpO2 100%   BMI 30.13 kg/m  Wt Readings from Last 3 Encounters:  05/02/21 204 lb (92.5 kg)  08/30/20 205 lb (93 kg)  04/29/18 199 lb 3.2 oz (90.4 kg)       Assessment & Plan:   Problem List Items Addressed This Visit       Unprioritized   THYROID CANCER, HX OF    Clinically stable. Being followed by endocrinology (Dr. Posey Pronto).       Routine general medical examination at a health care facility    Discussed healthy diet, exercise. Refer for colo. Declines flu shot and covid vaccine. Tetanus is up to date. Recommended that he schedule follow up dental.       Hypogonadism in male    Clinically stable on clomid- management per Urology.       Hypertriglyceridemia   Relevant Orders   Lipid panel   Attention deficit disorder    Stable. He would like to try decreasing his dose of adderall. Will decrease from 54m to 281mto see how he does.      Other Visit Diagnoses     Preventative health care    -  Primary   Relevant Orders   Ambulatory referral to Gastroenterology   Hyperglycemia       Relevant Orders   Comp Met (CMET)      Meds ordered this encounter  Medications   amphetamine-dextroamphetamine (ADDERALL XR) 20 MG 24 hr capsule    Sig: Take 1 capsule (20 mg total) by mouth  every morning.    Dispense:  30 capsule    Refill:  0    Order Specific Question:   Supervising Provider    Answer:   BLPenni Homans [[id number]]    I, MeDebbrah AlarNP, personally preformed the services described in this documentation.  All medical record entries made by the scribe were at my direction and in my presence.  I have reviewed the chart and discharge instructions (if applicable) and agree that the record reflects my personal performance and is accurate and complete. 05/02/2021  I,Zachary [name],acting as a scEducation administratoror MeNance PearNP.,have documented all relevant documentation on the behalf of MeNance PearNP,as directed by  MeNance PearNP while in the presence of MeNance PearNP.   MeNance PearNP

## 2021-05-02 NOTE — Assessment & Plan Note (Signed)
Discussed healthy diet, exercise. Refer for colo. Declines flu shot and covid vaccine. Tetanus is up to date. Recommended that he schedule follow up dental.

## 2021-05-02 NOTE — Assessment & Plan Note (Signed)
Clinically stable on clomid- management per Urology.

## 2021-05-03 LAB — COMPREHENSIVE METABOLIC PANEL
ALT: 29 U/L (ref 0–53)
AST: 14 U/L (ref 0–37)
Albumin: 4.4 g/dL (ref 3.5–5.2)
Alkaline Phosphatase: 84 U/L (ref 39–117)
BUN: 18 mg/dL (ref 6–23)
CO2: 28 mEq/L (ref 19–32)
Calcium: 9.4 mg/dL (ref 8.4–10.5)
Chloride: 103 mEq/L (ref 96–112)
Creatinine, Ser: 1.15 mg/dL (ref 0.40–1.50)
GFR: 77.06 mL/min (ref >60.00)
Glucose, Bld: 90 mg/dL (ref 70–99)
Potassium: 3.9 mEq/L (ref 3.5–5.1)
Sodium: 139 mEq/L (ref 135–145)
Total Bilirubin: 0.6 mg/dL (ref 0.2–1.2)
Total Protein: 6.8 g/dL (ref 6.0–8.3)

## 2021-05-03 LAB — LIPID PANEL
Cholesterol: 181 mg/dL (ref 0–200)
HDL: 37.8 mg/dL — ABNORMAL LOW (ref >39.00)
NonHDL: 143.41
Total CHOL/HDL Ratio: 5
Triglycerides: 256 mg/dL — ABNORMAL HIGH (ref 0.0–149.0)
VLDL: 51.2 mg/dL — ABNORMAL HIGH (ref 0.0–40.0)

## 2021-05-03 LAB — LDL CHOLESTEROL, DIRECT: Direct LDL: 121 mg/dL

## 2021-05-04 ENCOUNTER — Telehealth: Payer: Self-pay | Admitting: Family

## 2021-05-04 MED ORDER — FISH OIL 1000 MG PO CAPS: 3.0000 | ORAL_CAPSULE | Freq: Two times a day (BID) | ORAL | 0 refills | Status: AC

## 2021-05-04 NOTE — Telephone Encounter (Signed)
See mychart.  

## 2021-05-09 DIAGNOSIS — F411 Generalized anxiety disorder: Secondary | ICD-10-CM | POA: Diagnosis not present

## 2021-05-16 DIAGNOSIS — F411 Generalized anxiety disorder: Secondary | ICD-10-CM | POA: Diagnosis not present

## 2021-05-23 DIAGNOSIS — F411 Generalized anxiety disorder: Secondary | ICD-10-CM | POA: Diagnosis not present

## 2021-05-31 ENCOUNTER — Other Ambulatory Visit: Payer: Self-pay | Admitting: Family

## 2021-05-31 MED ORDER — AMPHETAMINE-DEXTROAMPHET ER 20 MG PO CP24: 20.0000 mg | ORAL_CAPSULE | ORAL | 0 refills | Status: DC

## 2021-05-31 NOTE — Telephone Encounter (Signed)
Last RX:  05-02-21 #30 Last OV:   05-02-21 Next OV:   no future appointments UDS:         09-05-20 CSC:         10-23-19

## 2021-06-06 DIAGNOSIS — F411 Generalized anxiety disorder: Secondary | ICD-10-CM | POA: Diagnosis not present

## 2021-06-13 DIAGNOSIS — F411 Generalized anxiety disorder: Secondary | ICD-10-CM | POA: Diagnosis not present

## 2021-06-20 DIAGNOSIS — F411 Generalized anxiety disorder: Secondary | ICD-10-CM | POA: Diagnosis not present

## 2021-06-27 DIAGNOSIS — F411 Generalized anxiety disorder: Secondary | ICD-10-CM | POA: Diagnosis not present

## 2021-06-29 ENCOUNTER — Other Ambulatory Visit: Payer: Self-pay | Admitting: Family

## 2021-06-29 NOTE — Telephone Encounter (Signed)
Requesting: Adderall XR 20mg  Contract: 09/05/20 UDS: 09/05/20 Last Visit: 05/02/21 Next Visit: none Last Refill: 05/31/21  Please Advise

## 2021-06-30 MED ORDER — AMPHETAMINE-DEXTROAMPHET ER 20 MG PO CP24: 20.0000 mg | ORAL_CAPSULE | ORAL | 0 refills | Status: DC

## 2021-07-11 DIAGNOSIS — F411 Generalized anxiety disorder: Secondary | ICD-10-CM | POA: Diagnosis not present

## 2021-07-25 DIAGNOSIS — F411 Generalized anxiety disorder: Secondary | ICD-10-CM | POA: Diagnosis not present

## 2021-07-27 DIAGNOSIS — D2239 Melanocytic nevi of other parts of face: Secondary | ICD-10-CM | POA: Diagnosis not present

## 2021-07-31 ENCOUNTER — Other Ambulatory Visit: Payer: Self-pay | Admitting: Family

## 2021-07-31 NOTE — Telephone Encounter (Signed)
Requesting: Adderall XR 20mg  Contract: 09/05/20 UDS: 09/05/20 Last Visit: 05/02/21 Next Visit: none Last Refill: 06/30/21  Please Advise

## 2021-08-01 DIAGNOSIS — F411 Generalized anxiety disorder: Secondary | ICD-10-CM | POA: Diagnosis not present

## 2021-08-01 MED ORDER — AMPHETAMINE-DEXTROAMPHET ER 20 MG PO CP24: 20.0000 mg | ORAL_CAPSULE | ORAL | 0 refills | Status: DC

## 2021-08-15 DIAGNOSIS — E291 Testicular hypofunction: Secondary | ICD-10-CM | POA: Diagnosis not present

## 2021-08-18 DIAGNOSIS — E291 Testicular hypofunction: Secondary | ICD-10-CM | POA: Diagnosis not present

## 2021-08-18 DIAGNOSIS — N529 Male erectile dysfunction, unspecified: Secondary | ICD-10-CM | POA: Diagnosis not present

## 2021-08-28 ENCOUNTER — Other Ambulatory Visit: Payer: Self-pay | Admitting: Family

## 2021-08-28 DIAGNOSIS — F411 Generalized anxiety disorder: Secondary | ICD-10-CM | POA: Diagnosis not present

## 2021-08-28 MED ORDER — AMPHETAMINE-DEXTROAMPHET ER 20 MG PO CP24: 20.0000 mg | ORAL_CAPSULE | ORAL | 0 refills | Status: DC

## 2021-08-28 NOTE — Telephone Encounter (Signed)
Patient is requesting a refill of the following medications: Requested Prescriptions   Pending Prescriptions Disp Refills   amphetamine-dextroamphetamine (ADDERALL XR) 20 MG 24 hr capsule 30 capsule 0    Sig: Take 1 capsule (20 mg total) by mouth every morning.    Date of patient request: 08/28/21 Last office visit: 05/02/21 Date of last refill: 08/01/21 Last refill amount: 30 + 0 Follow up time period per chart: none scheduled   UDS & Contract: 09/05/20 & 10/23/19

## 2021-08-30 ENCOUNTER — Other Ambulatory Visit: Payer: Self-pay | Admitting: Family

## 2021-08-30 ENCOUNTER — Telehealth: Payer: Self-pay | Admitting: Family

## 2021-08-30 NOTE — Telephone Encounter (Signed)
[facility] stated [facility] out of [facility], and he was wondering if he could get his rx sent to [facility] instead.   Medication: amphetamine-dextroamphetamine (ADDERALL XR) 20 MG 24 hr capsule   Has the patient contacted their pharmacy? Yes.    Preferred [facility]acy: [facility] [address], [place], [place] [address] [phone]

## 2021-08-30 NOTE — Telephone Encounter (Signed)
Please see Pt note: [facility] does not have this in stock. Would you please send Rx to [facility] [place] [facility]

## 2021-08-31 MED ORDER — AMPHETAMINE-DEXTROAMPHET ER 20 MG PO CP24: 20.0000 mg | ORAL_CAPSULE | ORAL | 0 refills | Status: DC

## 2021-08-31 NOTE — Telephone Encounter (Signed)
Please cancel original rx at [facility].  Rx has been sent to [facility].

## 2021-08-31 NOTE — Telephone Encounter (Signed)
Rx cancelled at [facility]

## 2021-09-14 DIAGNOSIS — F411 Generalized anxiety disorder: Secondary | ICD-10-CM | POA: Diagnosis not present

## 2021-09-19 DIAGNOSIS — F411 Generalized anxiety disorder: Secondary | ICD-10-CM | POA: Diagnosis not present

## 2021-09-26 DIAGNOSIS — F411 Generalized anxiety disorder: Secondary | ICD-10-CM | POA: Diagnosis not present

## 2021-09-27 ENCOUNTER — Other Ambulatory Visit: Payer: Self-pay | Admitting: Family

## 2021-09-27 NOTE — Telephone Encounter (Signed)
Requesting: Adderall XR 20MG  Contract: 09/05/20 UDS: 09/05/20 Last Visit: 05/02/21 Next Visit: Not scheduled Last Refill: 08/31/21  Please Advise

## 2021-09-28 MED ORDER — AMPHETAMINE-DEXTROAMPHET ER 20 MG PO CP24: 20.0000 mg | ORAL_CAPSULE | ORAL | 0 refills | Status: DC

## 2021-09-28 NOTE — Telephone Encounter (Signed)
Refill sent. Pt will need f/u OV prior to additional refills.

## 2021-10-06 NOTE — Telephone Encounter (Signed)
Lvm for patient to be aware will need f/up prior to additional refills

## 2021-10-10 DIAGNOSIS — F411 Generalized anxiety disorder: Secondary | ICD-10-CM | POA: Diagnosis not present

## 2021-10-10 NOTE — Telephone Encounter (Signed)
Patient is now scheduled for 10-17-21

## 2021-10-17 ENCOUNTER — Ambulatory Visit: Payer: BC Managed Care – PPO | Admitting: Family

## 2021-10-18 DIAGNOSIS — C73 Malignant neoplasm of thyroid gland: Secondary | ICD-10-CM | POA: Diagnosis not present

## 2021-10-18 DIAGNOSIS — E890 Postprocedural hypothyroidism: Secondary | ICD-10-CM | POA: Diagnosis not present

## 2021-10-23 ENCOUNTER — Ambulatory Visit: Payer: BC Managed Care – PPO | Admitting: Family

## 2021-10-23 VITALS — BP 137/88 | HR 71 | Temp 98.5°F | Resp 16 | Ht 69.0 in | Wt 208.0 lb

## 2021-10-23 DIAGNOSIS — D171 Benign lipomatous neoplasm of skin and subcutaneous tissue of trunk: Secondary | ICD-10-CM | POA: Insufficient documentation

## 2021-10-23 DIAGNOSIS — F988 Other specified behavioral and emotional disorders with onset usually occurring in childhood and adolescence: Secondary | ICD-10-CM | POA: Diagnosis not present

## 2021-10-23 DIAGNOSIS — Z1211 Encounter for screening for malignant neoplasm of colon: Secondary | ICD-10-CM

## 2021-10-23 DIAGNOSIS — Z85850 Personal history of malignant neoplasm of thyroid: Secondary | ICD-10-CM

## 2021-10-23 MED ORDER — AMPHETAMINE-DEXTROAMPHET ER 20 MG PO CP24: 20.0000 mg | ORAL_CAPSULE | ORAL | 0 refills | Status: DC

## 2021-10-23 NOTE — Assessment & Plan Note (Signed)
Has been there for some time but becoming more bothersome. Will refer to surgery for possible excision.  ?

## 2021-10-23 NOTE — Progress Notes (Signed)
? ?Subjective:  ? ?By signing my name below, I, Joaquin Music, attest that this documentation has been prepared under the direction and in the presence of Debbrah Alar, NP 10/23/2021   ?  ? ? Patient ID: Zachary [name], male    DOB: January 27, 1976, 46 y.o.   MRN: [id number] ? ?Chief Complaint  ?Patient presents with  ? ADHD  ?  Doing well on adderall  ? Lipoma  ?  Reports "lipoma on back is growing"  ? ? ?HPI ?Patient is in today for an office visit. ? ?Lipoma- He is complaining of a lipoma on his back that has grown recently. He does not think it is worrisome but he is getting uncomfortable and more aware of it and would like to meet with a surgeon. ? ?ADD- He reports no problems with adderall and finding it in pharmacies. Able to maintain good focus.  ? ?Thyroid Cancer- He is still seeing an endocrinologist and is still consistent with 200 mcg synthroid. ? ?Immunizations- He is not interested in the Covid-19 vaccines.  ? ?Past Medical History:  ?Diagnosis Date  ? ADD (attention deficit disorder)   ? History of papillary adenocarcinoma of thyroid   ? s/p thyroidectomy 06/2003  ? History of tobacco abuse   ? Hypertriglyceridemia 06/03/2019  ? Iatrogenic hyperthyroidism   ? ? ?Past Surgical History:  ?Procedure Laterality Date  ? THYROIDECTOMY  06/2003  ? TONSILLECTOMY  1985  ? ? ?Family History  ?Problem Relation Age of Onset  ? Achalasia Father 30  ? Other Mother 28  ?     healthy  ? Hypertension Mother   ? Coronary artery disease Paternal Grandmother   ? Stroke Maternal Grandfather   ? Diabetes Neg Hx   ? ? ?Social History  ? ?Socioeconomic History  ? Marital status: Married  ?  Spouse name: Not on file  ? Number of children: Not on file  ? Years of education: Not on file  ? Highest education level: Not on file  ?Occupational History  ? Not on file  ?Tobacco Use  ? Smoking status: Former  ? Smokeless tobacco: Never  ? Tobacco comments:  ?  Quit  about 7 years ago. 3-5 pack year history  ?Substance and Sexual  Activity  ? Alcohol use: Yes  ?  Comment: 2-3 drinks per week  ? Drug use: No  ? Sexual activity: Yes  ?  Partners: Female  ?Other Topics Concern  ? Not on file  ?Social History Narrative  ? 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 Married  ? 2 daughters,  2004 and 2007  ? Former Smoker quit 7 yrs ago.  3 to 5 pack year history  ? Alcohol use-no        ? 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 Plays soccer, guitar  ? ?Social Determinants of Health  ? ?Financial Resource Strain: Not on file  ?Food Insecurity: Not on file  ?Transportation Needs: Not on file  ?Physical Activity: Not on file  ?Stress: Not on file  ?Social Connections: Not on file  ?Intimate Partner Violence: Not on file  ? ? ?Outpatient Medications Prior to Visit  ?Medication Sig Dispense Refill  ? clomiPHENE (CLOMID) 50 MG tablet Take 25 mg by mouth daily.    ? levothyroxine (SYNTHROID, LEVOTHROID) 200 MCG tablet Take 1 tablet by mouth daily.    ? Omega-3 Fatty Acids (FISH OIL) 1000 MG CAPS Take 3 capsules (3,000 mg total) by mouth in the  morning and at bedtime.  0  ? sildenafil (REVATIO) 20 MG tablet SMARTSIG:1-5 Tablet(s) By Mouth Daily PRN    ? amphetamine-dextroamphetamine (ADDERALL XR) 20 MG 24 hr capsule Take 1 capsule (20 mg total) by mouth every morning. 30 capsule 0  ? CIALIS 5 MG tablet Take 2.5 mg by mouth daily.    ? ?No facility-administered medications prior to visit.  ? ? ?No Known Allergies ? ?Review of Systems  ?Constitutional:  Negative for fever.  ?HENT:  Negative for ear pain and hearing loss.   ?     (-)nystagmus ?(-)adenopathy  ?Eyes:  Negative for blurred vision.  ?Respiratory:  Negative for cough, shortness of breath and wheezing.   ?Cardiovascular:  Negative for chest pain and leg swelling.  ?Gastrointestinal:  Negative for blood in stool, diarrhea, nausea and vomiting.  ?Genitourinary:  Negative for dysuria and frequency.  ?Musculoskeletal:  Negative for joint pain and myalgias.  ?Skin:  Negative for rash.  ?      (+) mass on back   ?Neurological:  Negative for headaches.  ?Psychiatric/Behavioral:  Negative for depression. The patient is not nervous/anxious.   ? ?   ?Objective:  ?  ?Physical Exam ?Constitutional:   ?   General: He is not in acute distress. ?   Appearance: Normal appearance.  ?HENT:  ?   Head: Normocephalic and atraumatic.  ?Cardiovascular:  ?   Rate and Rhythm: Normal rate and regular rhythm.  ?   Heart sounds: No murmur heard. ?Pulmonary:  ?   Effort: No respiratory distress.  ?   Breath sounds: Normal breath sounds. No wheezing or rales.  ?Skin: ?   General: Skin is warm and dry.  ?   Comments: 3 x 2 inch mass near thoracic spine; soft and non-tender  ?Neurological:  ?   Mental Status: He is alert and oriented to person, place, and time.  ?Psychiatric:     ?   Behavior: Behavior normal.     ?   Thought Content: Thought content normal.  ? ? ?BP 137/88 (BP Location: Right Arm, Patient Position: Sitting, Cuff Size: Large)   Pulse 71   Temp 98.5 ?F (36.9 ?C) (Oral)   Resp 16   Ht '5\' 9"'$  (1.753 m)   Wt 208 lb (94.3 kg)   SpO2 100%   BMI 30.72 kg/m?  ?Wt Readings from Last 3 Encounters:  ?10/23/21 208 lb (94.3 kg)  ?05/02/21 204 lb (92.5 kg)  ?08/30/20 205 lb (93 kg)  ? ? ?Assessment & Plan:  ? ?Problem List Items Addressed This Visit   ? ?  ? Unprioritized  ? THYROID CANCER, HX OF  ?  Continues synthroid which is managed by endocrinology.  ?  ?  ? Lipoma of torso - Primary  ?  Has been there for some time but becoming more bothersome. Will refer to surgery for possible excision.  ?  ?  ? Relevant Orders  ? Ambulatory referral to General Surgery  ? Attention deficit disorder  ?  Stable, continue Adderall XR '20mg'$ . Controlled substance contract is updated and UDS is ordered.   ?  ?  ? Relevant Medications  ? amphetamine-dextroamphetamine (ADDERALL XR) 20 MG 24 hr capsule  ? Other Relevant Orders  ? DRUG MONITORING, PANEL 8 WITH CONFIRMATION, URINE  ? ?Other Visit Diagnoses   ? ? Colon cancer screening      ?  Relevant Orders  ? Ambulatory referral to Gastroenterology  ? ?  ? ? ? ?  Meds ordered this encounter  ?Medications  ? amphetamine-dextroamphetamine (ADDERALL XR) 20 MG 24 hr capsule  ?  Sig: Take 1 capsule (20 mg total) by mouth every morning.  ?  Dispense:  30 capsule  ?  Refill:  0  ?  Order Specific Question:   Supervising Provider  ?  Answer:   Penni Homans A [[id number]]  ? ? ?I,Zite Okoli,acting as a Education administrator for [employer], NP.,have documented all relevant documentation on the behalf of Nance Pear, NP,as directed by  Nance Pear, NP while in the presence of Nance Pear, NP.  ? ?I, Debbrah Alar, NP, personally preformed the services described in this documentation.  All medical record entries made by the scribe were at my direction and in my presence.  I have reviewed the chart and discharge instructions (if applicable) and agree that the record reflects my personal performance and is accurate and complete. 10/23/2021 ?

## 2021-10-23 NOTE — Assessment & Plan Note (Signed)
Continues synthroid which is managed by endocrinology.  ?

## 2021-10-23 NOTE — Progress Notes (Deleted)
? ?Subjective:  ? ? ? Patient ID: Zachary [name], male    DOB: Sep 28, 1975, 46 y.o.   MRN: [id number] ? ?Chief Complaint  ?Patient presents with  ? ADHD  ?  Doing well on adderall  ? Lipoma  ?  Reports "lipoma on back is growing"  ? ? ?HPI ? ? ?Health Maintenance Due  ?Topic Date Due  ? HIV Screening  Never done  ? Hepatitis C Screening  Never done  ? COLONOSCOPY (Pts 45-77yr Insurance coverage will need to be confirmed)  Never done  ? ? ?Past Medical History:  ?Diagnosis Date  ? ADD (attention deficit disorder)   ? History of papillary adenocarcinoma of thyroid   ? s/p thyroidectomy 06/2003  ? History of tobacco abuse   ? Hypertriglyceridemia 06/03/2019  ? Iatrogenic hyperthyroidism   ? ? ?Past Surgical History:  ?Procedure Laterality Date  ? THYROIDECTOMY  06/2003  ? TONSILLECTOMY  1985  ? ? ?Family History  ?Problem Relation Age of Onset  ? Achalasia Father 62 ? Other Mother 528 ?     healthy  ? Hypertension Mother   ? Coronary artery disease Paternal Grandmother   ? Stroke Maternal Grandfather   ? Diabetes Neg Hx   ? ? ?Social History  ? ?Socioeconomic History  ? Marital status: Married  ?  Spouse name: Not on file  ? Number of children: Not on file  ? Years of education: Not on file  ? Highest education level: Not on file  ?Occupational History  ? Not on file  ?Tobacco Use  ? Smoking status: Former  ? Smokeless tobacco: Never  ? Tobacco comments:  ?  Quit  about 7 years ago. 3-5 pack year history  ?Substance and Sexual Activity  ? Alcohol use: Yes  ?  Comment: 2-3 drinks per week  ? Drug use: No  ? Sexual activity: Yes  ?  Partners: Female  ?Other Topics Concern  ? Not on file  ?Social History Narrative  ? 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 Married  ? 2 daughters,  2004 and 2007  ? Former Smoker quit 7 yrs ago.  3 to 5 pack year history  ? Alcohol use-no        ? 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 Plays soccer, guitar  ? ?Social Determinants of Health  ? ?Financial Resource Strain: Not on  file  ?Food Insecurity: Not on file  ?Transportation Needs: Not on file  ?Physical Activity: Not on file  ?Stress: Not on file  ?Social Connections: Not on file  ?Intimate Partner Violence: Not on file  ? ? ?Outpatient Medications Prior to Visit  ?Medication Sig Dispense Refill  ? clomiPHENE (CLOMID) 50 MG tablet Take 25 mg by mouth daily.    ? levothyroxine (SYNTHROID, LEVOTHROID) 200 MCG tablet Take 1 tablet by mouth daily.    ? Omega-3 Fatty Acids (FISH OIL) 1000 MG CAPS Take 3 capsules (3,000 mg total) by mouth in the morning and at bedtime.  0  ? sildenafil (REVATIO) 20 MG tablet SMARTSIG:1-5 Tablet(s) By Mouth Daily PRN    ? amphetamine-dextroamphetamine (ADDERALL XR) 20 MG 24 hr capsule Take 1 capsule (20 mg total) by mouth every morning. 30 capsule 0  ? CIALIS 5 MG tablet Take 2.5 mg by mouth daily.    ? ?No facility-administered medications prior to visit.  ? ? ?No Known Allergies ? ?ROS ? ?   ?Objective:  ?  ?Physical Exam ? ?  BP 137/88 (BP Location: Right Arm, Patient Position: Sitting, Cuff Size: Large)   Pulse 71   Temp 98.5 ?F (36.9 ?C) (Oral)   Resp 16   Ht '5\' 9"'$  (1.753 m)   Wt 208 lb (94.3 kg)   SpO2 100%   BMI 30.72 kg/m?  ?Wt Readings from Last 3 Encounters:  ?10/23/21 208 lb (94.3 kg)  ?05/02/21 204 lb (92.5 kg)  ?08/30/20 205 lb (93 kg)  ? ? ?   ?Assessment & Plan:  ? ?Problem List Items Addressed This Visit   ? ?  ? Unprioritized  ? THYROID CANCER, HX OF  ?  Continues synthroid which is managed by endocrinology.  ?  ?  ? Lipoma of torso - Primary  ?  Has been there for some time but becoming more bothersome. Will refer to surgery for possible excision.  ?  ?  ? Relevant Orders  ? Ambulatory referral to General Surgery  ? Attention deficit disorder  ?  Stable, continue Adderall XR '20mg'$ . Controlled substance contract is updated and UDS is ordered.   ?  ?  ? Relevant Medications  ? amphetamine-dextroamphetamine (ADDERALL XR) 20 MG 24 hr capsule  ? Other Relevant Orders  ? DRUG MONITORING,  PANEL 8 WITH CONFIRMATION, URINE  ? ?Other Visit Diagnoses   ? ? Colon cancer screening      ? Relevant Orders  ? Ambulatory referral to Gastroenterology  ? ?  ? ? ?I have discontinued [name]'s Cialis. I am also having him maintain his clomiPHENE, levothyroxine, Fish Oil, sildenafil, and amphetamine-dextroamphetamine. ? ?Meds ordered this encounter  ?Medications  ? amphetamine-dextroamphetamine (ADDERALL XR) 20 MG 24 hr capsule  ?  Sig: Take 1 capsule (20 mg total) by mouth every morning.  ?  Dispense:  30 capsule  ?  Refill:  0  ?  Order Specific Question:   Supervising Provider  ?  Answer:   Penni Homans A [[id number]]  ? ? ?

## 2021-10-23 NOTE — Assessment & Plan Note (Signed)
Stable, continue Adderall XR '20mg'$ . Controlled substance contract is updated and UDS is ordered.   ?

## 2021-10-24 DIAGNOSIS — F411 Generalized anxiety disorder: Secondary | ICD-10-CM | POA: Diagnosis not present

## 2021-10-25 LAB — DRUG MONITORING, PANEL 8 WITH CONFIRMATION, URINE
6 Acetylmorphine: NEGATIVE ng/mL (ref <10)
Alcohol Metabolites: NEGATIVE ng/mL (ref <500)
Amphetamine: 3149 ng/mL — ABNORMAL HIGH (ref <250)
Amphetamines: POSITIVE ng/mL — AB (ref <500)
Benzodiazepines: NEGATIVE ng/mL (ref <100)
Buprenorphine, Urine: NEGATIVE ng/mL (ref <5)
Cocaine Metabolite: NEGATIVE ng/mL (ref <150)
Creatinine: 118.6 mg/dL (ref >=20.0)
MDMA: NEGATIVE ng/mL (ref <500)
Marijuana Metabolite: NEGATIVE ng/mL (ref <20)
Methamphetamine: NEGATIVE ng/mL (ref <250)
Opiates: NEGATIVE ng/mL (ref <100)
Oxidant: NEGATIVE ug/mL (ref <200)
Oxycodone: NEGATIVE ng/mL (ref <100)
pH: 6.2 (ref 4.5–9.0)

## 2021-10-25 LAB — DM TEMPLATE

## 2021-11-01 DIAGNOSIS — F411 Generalized anxiety disorder: Secondary | ICD-10-CM | POA: Diagnosis not present

## 2021-11-08 DIAGNOSIS — F411 Generalized anxiety disorder: Secondary | ICD-10-CM | POA: Diagnosis not present

## 2021-11-21 DIAGNOSIS — F411 Generalized anxiety disorder: Secondary | ICD-10-CM | POA: Diagnosis not present

## 2021-11-28 DIAGNOSIS — F411 Generalized anxiety disorder: Secondary | ICD-10-CM | POA: Diagnosis not present

## 2021-11-29 ENCOUNTER — Other Ambulatory Visit: Payer: Self-pay | Admitting: Family

## 2021-11-29 DIAGNOSIS — F988 Other specified behavioral and emotional disorders with onset usually occurring in childhood and adolescence: Secondary | ICD-10-CM

## 2021-11-29 MED ORDER — AMPHETAMINE-DEXTROAMPHET ER 20 MG PO CP24: 20.0000 mg | ORAL_CAPSULE | ORAL | 0 refills | Status: DC

## 2021-11-29 NOTE — Telephone Encounter (Signed)
Requesting: Adderall XR '20mg'$   ?Contract: 10/23/21 ?UDS: 10/23/21 ?Last Visit: 10/23/21 ?Next Visit: 05/14/22 ?Last Refill: 10/23/21 #30 and 0RF ? ?Please Advise ? ?

## 2021-11-30 ENCOUNTER — Telehealth: Payer: Self-pay | Admitting: Family

## 2021-11-30 DIAGNOSIS — F988 Other specified behavioral and emotional disorders with onset usually occurring in childhood and adolescence: Secondary | ICD-10-CM

## 2021-11-30 NOTE — Telephone Encounter (Signed)
[facility] states that [facility] is out of [facility] and he will need it sent elsewhere. He is hoping he can get it sent in today and is wondering if another dr could write off on rx as Lenna Sciara is not here today. Please advise.  ? ?Medication:  ?amphetamine-dextroamphetamine (ADDERALL XR) 20 MG 24 hr capsule  ? ?Has the patient contacted their pharmacy? Yes.   ? ? ?Preferred [facility]acy: [facility] ?[name] [place], [place] [address] ?[phone] ?

## 2021-12-01 ENCOUNTER — Other Ambulatory Visit (HOSPITAL_BASED_OUTPATIENT_CLINIC_OR_DEPARTMENT_OTHER): Payer: Self-pay

## 2021-12-01 ENCOUNTER — Telehealth: Payer: Self-pay | Admitting: Family

## 2021-12-01 MED ORDER — AMPHETAMINE-DEXTROAMPHET ER 20 MG PO CP24: 20.0000 mg | ORAL_CAPSULE | ORAL | 0 refills | Status: DC

## 2021-12-01 MED ORDER — AMPHETAMINE-DEXTROAMPHET ER 20 MG PO CP24
20.0000 mg | ORAL_CAPSULE | ORAL | 0 refills | Status: DC
  Filled 2021-12-01: qty 30, 30d supply, fill #0

## 2021-12-01 NOTE — Telephone Encounter (Signed)
Pt called to follow up of refill status. Advised him that it has been sent to [facility] to review. ?

## 2021-12-01 NOTE — Addendum Note (Signed)
Addended by: Debbrah Alar on: 12/01/2021 01:01 PM ? ? Modules accepted: Orders ? ?

## 2021-12-01 NOTE — Telephone Encounter (Signed)
Could you please cancel rx at [place] for Adderall xr '20mg'$ .  ?

## 2021-12-01 NOTE — Telephone Encounter (Signed)
Opened in error

## 2021-12-05 DIAGNOSIS — F411 Generalized anxiety disorder: Secondary | ICD-10-CM | POA: Diagnosis not present

## 2021-12-12 DIAGNOSIS — F411 Generalized anxiety disorder: Secondary | ICD-10-CM | POA: Diagnosis not present

## 2021-12-29 ENCOUNTER — Other Ambulatory Visit: Payer: Self-pay | Admitting: Family

## 2021-12-29 DIAGNOSIS — F988 Other specified behavioral and emotional disorders with onset usually occurring in childhood and adolescence: Secondary | ICD-10-CM

## 2022-01-01 NOTE — Telephone Encounter (Signed)
Requesting: Adderall XR '20mg'$   Contract:10/23/21 UDS: 10/23/21 Last Visit: 10/23/21 Next Visit: 05/14/22 Last Refill: 12/01/21 #30 and 0RF  Please Advise

## 2022-01-02 ENCOUNTER — Other Ambulatory Visit: Payer: Self-pay | Admitting: Family

## 2022-01-02 DIAGNOSIS — F988 Other specified behavioral and emotional disorders with onset usually occurring in childhood and adolescence: Secondary | ICD-10-CM

## 2022-01-02 MED ORDER — AMPHETAMINE-DEXTROAMPHET ER 20 MG PO CP24: 20.0000 mg | ORAL_CAPSULE | ORAL | 0 refills | Status: DC

## 2022-01-19 DIAGNOSIS — F411 Generalized anxiety disorder: Secondary | ICD-10-CM | POA: Diagnosis not present

## 2022-01-23 DIAGNOSIS — F411 Generalized anxiety disorder: Secondary | ICD-10-CM | POA: Diagnosis not present

## 2022-01-30 ENCOUNTER — Other Ambulatory Visit: Payer: Self-pay | Admitting: Family

## 2022-01-30 DIAGNOSIS — F988 Other specified behavioral and emotional disorders with onset usually occurring in childhood and adolescence: Secondary | ICD-10-CM

## 2022-01-30 MED ORDER — AMPHETAMINE-DEXTROAMPHET ER 20 MG PO CP24: 20.0000 mg | ORAL_CAPSULE | ORAL | 0 refills | Status: DC

## 2022-01-30 NOTE — Telephone Encounter (Signed)
Requesting: Adderall XR '20mg'$   Contract: 10/23/21 UDS: 10/23/21 Last Visit: 10/23/21 Next Visit: 05/14/22 Last Refill: 01/02/22 #30 and 0RF  Please Advise

## 2022-02-08 DIAGNOSIS — F411 Generalized anxiety disorder: Secondary | ICD-10-CM | POA: Diagnosis not present

## 2022-03-02 ENCOUNTER — Other Ambulatory Visit: Payer: Self-pay | Admitting: Family

## 2022-03-02 DIAGNOSIS — F988 Other specified behavioral and emotional disorders with onset usually occurring in childhood and adolescence: Secondary | ICD-10-CM

## 2022-03-02 MED ORDER — AMPHETAMINE-DEXTROAMPHET ER 20 MG PO CP24: 20.0000 mg | ORAL_CAPSULE | ORAL | 0 refills | Status: DC

## 2022-03-02 NOTE — Telephone Encounter (Signed)
Requesting: Adderall XR '20mg'$  Contract: 10/23/21 UDS: 10/23/21 Last Visit: 10/23/21 Next Visit:05/14/22 Last Refill: 01/30/22  Please Advise

## 2022-03-12 DIAGNOSIS — F411 Generalized anxiety disorder: Secondary | ICD-10-CM | POA: Diagnosis not present

## 2022-03-19 DIAGNOSIS — F411 Generalized anxiety disorder: Secondary | ICD-10-CM | POA: Diagnosis not present

## 2022-03-30 ENCOUNTER — Other Ambulatory Visit: Payer: Self-pay | Admitting: Family

## 2022-03-30 DIAGNOSIS — F988 Other specified behavioral and emotional disorders with onset usually occurring in childhood and adolescence: Secondary | ICD-10-CM

## 2022-03-30 MED ORDER — AMPHETAMINE-DEXTROAMPHET ER 20 MG PO CP24: 20.0000 mg | ORAL_CAPSULE | ORAL | 0 refills | Status: DC

## 2022-03-30 NOTE — Telephone Encounter (Signed)
Requesting:adderall 20 mg Contract:10/23/21 UDS:10/23/21 Last Visit:10/23/21 Next Visit:05/14/22 Last Refill:03/02/22  Please Advise

## 2022-04-03 DIAGNOSIS — F411 Generalized anxiety disorder: Secondary | ICD-10-CM | POA: Diagnosis not present

## 2022-04-11 DIAGNOSIS — F411 Generalized anxiety disorder: Secondary | ICD-10-CM | POA: Diagnosis not present

## 2022-04-17 DIAGNOSIS — F411 Generalized anxiety disorder: Secondary | ICD-10-CM | POA: Diagnosis not present

## 2022-05-02 ENCOUNTER — Other Ambulatory Visit: Payer: Self-pay | Admitting: Family Medicine

## 2022-05-02 DIAGNOSIS — F988 Other specified behavioral and emotional disorders with onset usually occurring in childhood and adolescence: Secondary | ICD-10-CM

## 2022-05-02 NOTE — Telephone Encounter (Signed)
Requesting: Adderall XR '20mg'$   Contract: 10/23/21 UDS: 10/23/21 Last Visit: 10/23/21 Next Visit: 05/14/22 Last Refill: 03/30/22 #30 and 0RF  Please Advise

## 2022-05-03 DIAGNOSIS — F411 Generalized anxiety disorder: Secondary | ICD-10-CM | POA: Diagnosis not present

## 2022-05-03 MED ORDER — AMPHETAMINE-DEXTROAMPHET ER 20 MG PO CP24: 20.0000 mg | ORAL_CAPSULE | ORAL | 0 refills | Status: DC

## 2022-05-14 ENCOUNTER — Ambulatory Visit (INDEPENDENT_AMBULATORY_CARE_PROVIDER_SITE_OTHER): Payer: BC Managed Care – PPO | Admitting: Family

## 2022-05-14 ENCOUNTER — Encounter: Payer: Self-pay | Admitting: Family

## 2022-05-14 VITALS — BP 140/86 | HR 80 | Temp 98.2°F | Ht 70.0 in | Wt 207.6 lb

## 2022-05-14 DIAGNOSIS — Z0000 Encounter for general adult medical examination without abnormal findings: Secondary | ICD-10-CM | POA: Diagnosis not present

## 2022-05-14 DIAGNOSIS — F988 Other specified behavioral and emotional disorders with onset usually occurring in childhood and adolescence: Secondary | ICD-10-CM

## 2022-05-14 DIAGNOSIS — E781 Pure hyperglyceridemia: Secondary | ICD-10-CM | POA: Diagnosis not present

## 2022-05-14 LAB — COMPREHENSIVE METABOLIC PANEL
ALT: 21 U/L (ref 0–53)
AST: 15 U/L (ref 0–37)
Albumin: 4.5 g/dL (ref 3.5–5.2)
Alkaline Phosphatase: 64 U/L (ref 39–117)
BUN: 15 mg/dL (ref 6–23)
CO2: 27 mEq/L (ref 19–32)
Calcium: 9.8 mg/dL (ref 8.4–10.5)
Chloride: 102 mEq/L (ref 96–112)
Creatinine, Ser: 1.08 mg/dL (ref 0.40–1.50)
GFR: 82.49 mL/min (ref >60.00)
Glucose, Bld: 100 mg/dL — ABNORMAL HIGH (ref 70–99)
Potassium: 4.3 mEq/L (ref 3.5–5.1)
Sodium: 139 mEq/L (ref 135–145)
Total Bilirubin: 0.5 mg/dL (ref 0.2–1.2)
Total Protein: 6.8 g/dL (ref 6.0–8.3)

## 2022-05-14 LAB — LIPID PANEL
Cholesterol: 194 mg/dL (ref 0–200)
HDL: 38.8 mg/dL — ABNORMAL LOW (ref >39.00)
NonHDL: 155.48
Total CHOL/HDL Ratio: 5
Triglycerides: 218 mg/dL — ABNORMAL HIGH (ref 0.0–149.0)
VLDL: 43.6 mg/dL — ABNORMAL HIGH (ref 0.0–40.0)

## 2022-05-14 LAB — LDL CHOLESTEROL, DIRECT: Direct LDL: 138 mg/dL

## 2022-05-14 NOTE — Assessment & Plan Note (Signed)
Discussed healthy diet, regular exercise. Encouraged pt to follow through with scheduling colonoscopy. Declines covid shot/flu shot. Tetanus up to date.

## 2022-05-14 NOTE — Progress Notes (Signed)
Subjective:     Patient ID: Zachary [name], male    DOB: [birth year], 46 y.o.   MRN: [id number]  Chief Complaint  Patient presents with   Annual Exam    CPE- not fasting     HPI Patient is in today for cpx.  Patient presents today for complete physical.  Immunizations: tdap 2017, declines covid vaccination and flu vaccination Diet: fair Exercise:  referee for soccer, plays soccer Wt Readings from Last 3 Encounters:  05/14/22 207 lb 9.6 oz (94.2 kg)  10/23/21 208 lb (94.3 kg)  05/02/21 204 lb (92.5 kg)  Vision: up to date Dental: due Colonoscopy: last year he did not schedule colonoscopy.   ADHD- continues adderall xr, overall doing well.     Health Maintenance Due  Topic Date Due   COLONOSCOPY (Pts 45-18yr Insurance coverage will need to be confirmed)  Never done    Past Medical History:  Diagnosis Date   ADD (attention deficit disorder)    History of papillary adenocarcinoma of thyroid    s/p thyroidectomy 06/2003   History of tobacco abuse    Hypertriglyceridemia 06/03/2019   Iatrogenic hyperthyroidism     Past Surgical History:  Procedure Laterality Date   THYROIDECTOMY  06/2003   TONSILLECTOMY  1985    Family History  Problem Relation Age of Onset   Achalasia Father 617  Other Mother 581      healthy   Hypertension Mother    Coronary artery disease Paternal Grandmother    Stroke Maternal Grandfather    Diabetes Neg Hx     Social History   Socioeconomic History   Marital status: Married    Spouse name: Not on file   Number of children: Not on file   Years of education: Not on file   Highest education level: Not on file  Occupational History   Not on file  Tobacco Use   Smoking status: Former   Smokeless tobacco: Never   Tobacco comments:    Quit  about 7 years ago. 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   Plays soccer, guitar   Social Determinants of Health   Financial Resource Strain: Not on file  Food Insecurity: Not on file  Transportation Needs: Not on file  Physical Activity: Not on file  Stress: Not on file  Social Connections: Not on file  Intimate Partner Violence: Not on file    Outpatient Medications Prior to Visit  Medication Sig Dispense Refill   amphetamine-dextroamphetamine (ADDERALL XR) 20 MG 24 hr capsule Take 1 capsule (20 mg total) by mouth every morning. 30 capsule 0   clomiPHENE (CLOMID) 50 MG tablet Take 25 mg by mouth daily.     levothyroxine (SYNTHROID, LEVOTHROID) 200 MCG tablet Take 1 tablet by mouth daily.     Omega-3 Fatty Acids (FISH OIL) 1000 MG CAPS Take 3 capsules (3,000 mg total) by mouth in the morning and at bedtime.  0   sildenafil (REVATIO) 20 MG tablet SMARTSIG:1-5 Tablet(s) By Mouth Daily PRN     No facility-administered medications prior to visit.    No Known Allergies  Review of Systems  Constitutional:  Negative for weight loss.  HENT:  Negative for congestion and hearing loss.   Eyes:  Negative for blurred vision.  Respiratory:  Positive for cough (due to allergies).   Cardiovascular:  Negative for leg swelling.  Gastrointestinal:  Negative for blood in stool, constipation and diarrhea.  Genitourinary:  Negative for dysuria and frequency.  Musculoskeletal:  Negative for joint pain and myalgias.  Neurological:  Negative for headaches.  Psychiatric/Behavioral:         Denies depression/anxiety       Objective:    Physical Exam  BP (!) 140/86   Pulse 80   Temp 98.2 F (36.8 C) (Oral)   Ht 5' 10" (1.778 m)   Wt 207 lb 9.6 oz (94.2 kg)   SpO2 99%   BMI  29.79 kg/m  Physical Exam  Constitutional: He is oriented to person, place, and time. He appears well-developed and well-nourished. No distress.  HENT:  Head: Normocephalic and atraumatic.  Right Ear: Tympanic membrane and ear canal normal.  Left Ear: Tympanic membrane and ear canal normal.  Mouth/Throat: Oropharynx is clear and moist.  Eyes: Pupils are equal, round, and reactive to light. No scleral icterus.  Neck: Normal range of motion. No thyromegaly present.  Cardiovascular: Normal rate and regular rhythm.   No murmur heard. Pulmonary/Chest: Effort normal and breath sounds normal. No respiratory distress. He has no wheezes. He has no rales. He exhibits no tenderness.  Abdominal: Soft. Bowel sounds are normal. He exhibits no distension and no mass. There is no tenderness. There is no rebound and no guarding.  Musculoskeletal: He exhibits no edema.  Lymphadenopathy:    He has no cervical adenopathy.  Neurological: He is alert and oriented to person, place, and time. He has normal patellar reflexes. He exhibits normal muscle tone. Coordination normal.  Skin: Skin is warm and dry (dark mole noted on nose- being followed by dermatology) Psychiatric: He has a normal mood and affect. His behavior is normal. Judgment and thought content normal.           Assessment & Plan:       Assessment & Plan:   Problem List Items Addressed This Visit       Unprioritized   Routine general medical examination at a health care facility    Discussed healthy diet, regular exercise. Encouraged pt to follow through with scheduling colonoscopy. Declines covid shot/flu shot. Tetanus up to date.       Hypertriglyceridemia   Relevant Orders   Comp Met (CMET)   Lipid panel   Attention deficit disorder    Stable. He may be interested in dropping dose to 15 mg at some point to lower to lowest effective dose. He will reach out to me when he is ready to do this. Controlled substance contract is up to  date.       Other Visit Diagnoses     Preventative health care    -  Primary   Relevant Orders   Ambulatory referral to Gastroenterology       I am having Zachary [name] B. [employer]" maintain his clomiPHENE, levothyroxine, Fish Oil, sildenafil, and amphetamine-dextroamphetamine.  No orders of the defined types were placed in this encounter.

## 2022-05-14 NOTE — Assessment & Plan Note (Addendum)
Stable. He may be interested in dropping dose to 15 mg at some point to lower to lowest effective dose. He will reach out to me when he is ready to do this. Controlled substance contract is up to date.

## 2022-05-17 DIAGNOSIS — F411 Generalized anxiety disorder: Secondary | ICD-10-CM | POA: Diagnosis not present

## 2022-05-30 DIAGNOSIS — F411 Generalized anxiety disorder: Secondary | ICD-10-CM | POA: Diagnosis not present

## 2022-05-31 ENCOUNTER — Other Ambulatory Visit: Payer: Self-pay | Admitting: Family

## 2022-05-31 DIAGNOSIS — F988 Other specified behavioral and emotional disorders with onset usually occurring in childhood and adolescence: Secondary | ICD-10-CM

## 2022-05-31 NOTE — Telephone Encounter (Signed)
Requesting: Adderall XR '20mg'$   Contract: 10/23/21 UDS: 10/23/21 Last Visit: 05/14/22 Next Visit: 11/19/22 Last Refill: 05/03/22 #30 and 0RF   Please Advise

## 2022-06-01 MED ORDER — AMPHETAMINE-DEXTROAMPHET ER 20 MG PO CP24: 20.0000 mg | ORAL_CAPSULE | ORAL | 0 refills | Status: DC

## 2022-06-07 DIAGNOSIS — F411 Generalized anxiety disorder: Secondary | ICD-10-CM | POA: Diagnosis not present

## 2022-06-14 DIAGNOSIS — F411 Generalized anxiety disorder: Secondary | ICD-10-CM | POA: Diagnosis not present

## 2022-06-18 DIAGNOSIS — F411 Generalized anxiety disorder: Secondary | ICD-10-CM | POA: Diagnosis not present

## 2022-06-28 DIAGNOSIS — F411 Generalized anxiety disorder: Secondary | ICD-10-CM | POA: Diagnosis not present

## 2022-06-30 ENCOUNTER — Other Ambulatory Visit: Payer: Self-pay | Admitting: Family

## 2022-06-30 DIAGNOSIS — F988 Other specified behavioral and emotional disorders with onset usually occurring in childhood and adolescence: Secondary | ICD-10-CM

## 2022-07-02 MED ORDER — AMPHETAMINE-DEXTROAMPHET ER 20 MG PO CP24: 20.0000 mg | ORAL_CAPSULE | ORAL | 0 refills | Status: DC

## 2022-07-02 NOTE — Telephone Encounter (Signed)
Requesting: Adderall  Contract: 10/23/2021 UDS: 10/23/2021 Last Visit: 05/14/2022 Next Visit: 11/19/2022 Last Refill: 06/01/2022  Please Advise

## 2022-07-30 ENCOUNTER — Other Ambulatory Visit: Payer: Self-pay | Admitting: Family

## 2022-07-30 DIAGNOSIS — F988 Other specified behavioral and emotional disorders with onset usually occurring in childhood and adolescence: Secondary | ICD-10-CM

## 2022-07-30 MED ORDER — AMPHETAMINE-DEXTROAMPHET ER 20 MG PO CP24: 20.0000 mg | ORAL_CAPSULE | ORAL | 0 refills | Status: DC

## 2022-07-30 NOTE — Telephone Encounter (Signed)
Requesting: Adderall XR '20mg'$  Contract: 10/23/21 UDS: 10/23/21 Last Visit: 05/14/22 Next Visit: 11/19/22 Last Refill: 07/02/22  Please Advise

## 2022-08-15 ENCOUNTER — Telehealth: Payer: BC Managed Care – PPO | Admitting: Family

## 2022-08-15 DIAGNOSIS — R6889 Other general symptoms and signs: Secondary | ICD-10-CM | POA: Diagnosis not present

## 2022-08-15 MED ORDER — OSELTAMIVIR PHOSPHATE 75 MG PO CAPS: 75.0000 mg | ORAL_CAPSULE | Freq: Two times a day (BID) | ORAL | 0 refills | Status: DC

## 2022-08-15 MED ORDER — BENZONATATE 100 MG PO CAPS: 100.0000 mg | ORAL_CAPSULE | Freq: Three times a day (TID) | ORAL | 0 refills | Status: DC | PRN

## 2022-08-15 MED ORDER — FLUTICASONE PROPIONATE 50 MCG/ACT NA SUSP: 2.0000 | Freq: Every day | NASAL | 6 refills | Status: DC

## 2022-08-15 NOTE — Progress Notes (Signed)
Virtual Visit Consent   [name], you are scheduled for a virtual visit with a [facility] provider today. Just as with appointments in the office, your consent must be obtained to participate. Your consent will be active for this visit and any virtual visit you may have with one of our providers in the next 365 days. If you have a MyChart account, a copy of this consent can be sent to you electronically.  As this is a virtual visit, video technology does not allow for your provider to perform a traditional examination. This may limit your provider's ability to fully assess your condition. If your provider identifies any concerns that need to be evaluated in person or the need to arrange testing (such as labs, EKG, etc.), we will make arrangements to do so. Although advances in technology are sophisticated, we cannot ensure that it will always work on either your end or our end. If the connection with a video visit is poor, the visit may have to be switched to a telephone visit. With either a video or telephone visit, we are not always able to ensure that we have a secure connection.  By engaging in this virtual visit, you consent to the provision of healthcare and authorize for your insurance to be billed (if applicable) for the services provided during this visit. Depending on your insurance coverage, you may receive a charge related to this service.  I need to obtain your verbal consent now. Are you willing to proceed with your visit today? Zachary [name] has provided verbal consent on 08/15/2022 for a virtual visit (video or telephone). Evelina Dun, FNP  Date: 08/15/2022 9:15 AM  Virtual Visit via Video Note   I, Evelina Dun, connected with  Zachary [name]  ([id number], 05-20-1976) on 08/15/22 at  9:30 AM EST by a video-enabled telemedicine application and verified that I am speaking with the correct person using two identifiers.  Location: Patient: Virtual Visit Location Patient:  Home Provider: Virtual Visit Location Provider: Office/Clinic   I discussed the limitations of evaluation and management by telemedicine and the availability of in person appointments. The patient expressed understanding and agreed to proceed.    History of Present Illness: Zachary [name] is a 47 y.o. who identifies as a male who was assigned male at birth, and is being seen today for cough and body aches that started yesterday.  HPI: Cough This is a new problem. The current episode started yesterday. The problem has been gradually worsening. The problem occurs every few minutes. The cough is Non-productive. Associated symptoms include chills, a fever, headaches, myalgias, nasal congestion, postnasal drip, rhinorrhea and wheezing. Pertinent negatives include no ear congestion, ear pain or shortness of breath. He has tried rest and OTC cough suppressant for the symptoms. The treatment provided mild relief.    Problems:  Patient Active Problem List   Diagnosis Date Noted   Lipoma of torso 10/23/2021   Hypertriglyceridemia 06/03/2019   Hypogonadism in male 10/25/2017   Erectile dysfunction 03/18/2013   Routine general medical examination at a health care facility 02/19/2012   Hypothyroidism 10/16/2007   Attention deficit disorder 10/16/2007   THYROID CANCER, HX OF 10/16/2007    Allergies: No Known Allergies Medications:  Current Outpatient Medications:    benzonatate (TESSALON PERLES) 100 MG capsule, Take 1 capsule (100 mg total) by mouth 3 (three) times daily as needed., Disp: 20 capsule, Rfl: 0   fluticasone (FLONASE) 50 MCG/ACT nasal spray, Place 2 sprays into  both nostrils daily., Disp: 16 g, Rfl: 6   oseltamivir (TAMIFLU) 75 MG capsule, Take 1 capsule (75 mg total) by mouth 2 (two) times daily., Disp: 10 capsule, Rfl: 0   amphetamine-dextroamphetamine (ADDERALL XR) 20 MG 24 hr capsule, Take 1 capsule (20 mg total) by mouth every morning., Disp: 30 capsule, Rfl: 0   clomiPHENE (CLOMID)  50 MG tablet, Take 25 mg by mouth daily., Disp: , Rfl:    levothyroxine (SYNTHROID, LEVOTHROID) 200 MCG tablet, Take 1 tablet by mouth daily., Disp: , Rfl:    Omega-3 Fatty Acids (FISH OIL) 1000 MG CAPS, Take 3 capsules (3,000 mg total) by mouth in the morning and at bedtime., Disp: , Rfl: 0   sildenafil (REVATIO) 20 MG tablet, SMARTSIG:1-5 Tablet(s) By Mouth Daily PRN, Disp: , Rfl:   Observations/Objective: Patient is well-developed, well-nourished in no acute distress.  Resting comfortably  at home.  Head is normocephalic, atraumatic.  No labored breathing.  Speech is clear and coherent with logical content.  Patient is alert and oriented at baseline.  Nasal congestion  Assessment and Plan: 1. Flu-like symptoms - oseltamivir (TAMIFLU) 75 MG capsule; Take 1 capsule (75 mg total) by mouth 2 (two) times daily.  Dispense: 10 capsule; Refill: 0 - benzonatate (TESSALON PERLES) 100 MG capsule; Take 1 capsule (100 mg total) by mouth 3 (three) times daily as needed.  Dispense: 20 capsule; Refill: 0 - fluticasone (FLONASE) 50 MCG/ACT nasal spray; Place 2 sprays into both nostrils daily.  Dispense: 16 g; Refill: 6  Force fluids Rest Tylenol  Droplet precautions  Follow up if symptoms worsen or do not improve   Follow Up Instructions: I discussed the assessment and treatment plan with the patient. The patient was provided an opportunity to ask questions and all were answered. The patient agreed with the plan and demonstrated an understanding of the instructions.  A copy of instructions were sent to the patient via MyChart unless otherwise noted below.     The patient was advised to call back or seek an in-person evaluation if the symptoms worsen or if the condition fails to improve as anticipated.  Time:  I spent 7 minutes with the patient via telehealth technology discussing the above problems/concerns.    Evelina Dun, FNP

## 2022-08-29 ENCOUNTER — Other Ambulatory Visit: Payer: Self-pay | Admitting: Family

## 2022-08-29 DIAGNOSIS — F988 Other specified behavioral and emotional disorders with onset usually occurring in childhood and adolescence: Secondary | ICD-10-CM

## 2022-08-29 NOTE — Telephone Encounter (Signed)
Requesting: Adderall XR '20mg'$   Contract:10/23/21 UDS:10/23/21 Last Visit: 05/14/22 Next Visit:11/19/22 Last Refill: 07/30/22 #30 and 0RF   Please Advise

## 2022-08-30 MED ORDER — AMPHETAMINE-DEXTROAMPHET ER 20 MG PO CP24: 20.0000 mg | ORAL_CAPSULE | ORAL | 0 refills | Status: DC

## 2022-09-28 ENCOUNTER — Other Ambulatory Visit: Payer: Self-pay | Admitting: Family

## 2022-09-28 DIAGNOSIS — F988 Other specified behavioral and emotional disorders with onset usually occurring in childhood and adolescence: Secondary | ICD-10-CM

## 2022-09-28 MED ORDER — AMPHETAMINE-DEXTROAMPHET ER 20 MG PO CP24: 20.0000 mg | ORAL_CAPSULE | ORAL | 0 refills | Status: DC

## 2022-09-28 NOTE — Telephone Encounter (Signed)
Requesting: Adderall XR 24m  Contract: 11/01/21 UDS: 10/23/21 Last Visit: 05/14/22 Next Visit: 11/19/22 Last Refill: 08/30/22 #30 and 0RF   Please Advise

## 2022-10-19 DIAGNOSIS — E890 Postprocedural hypothyroidism: Secondary | ICD-10-CM | POA: Diagnosis not present

## 2022-10-19 DIAGNOSIS — C73 Malignant neoplasm of thyroid gland: Secondary | ICD-10-CM | POA: Diagnosis not present

## 2022-10-29 ENCOUNTER — Other Ambulatory Visit: Payer: Self-pay | Admitting: Family

## 2022-10-29 DIAGNOSIS — F988 Other specified behavioral and emotional disorders with onset usually occurring in childhood and adolescence: Secondary | ICD-10-CM

## 2022-10-29 MED ORDER — AMPHETAMINE-DEXTROAMPHET ER 20 MG PO CP24: 20.0000 mg | ORAL_CAPSULE | ORAL | 0 refills | Status: DC

## 2022-10-29 NOTE — Telephone Encounter (Signed)
Requesting: Adderall XR 20mg   Contract: 10/23/21 UDS: 10/23/21 Last Visit: 05/14/22 Next Visit: 11/19/22 Last Refill: 09/28/22 #30 and 0RF   Please Advise

## 2022-11-19 ENCOUNTER — Ambulatory Visit: Payer: BC Managed Care – PPO | Admitting: Family

## 2022-11-19 VITALS — BP 142/88 | HR 71 | Temp 98.0°F | Resp 16 | Wt 208.0 lb

## 2022-11-19 DIAGNOSIS — E039 Hypothyroidism, unspecified: Secondary | ICD-10-CM | POA: Diagnosis not present

## 2022-11-19 DIAGNOSIS — F988 Other specified behavioral and emotional disorders with onset usually occurring in childhood and adolescence: Secondary | ICD-10-CM

## 2022-11-19 DIAGNOSIS — N529 Male erectile dysfunction, unspecified: Secondary | ICD-10-CM

## 2022-11-19 DIAGNOSIS — Z85850 Personal history of malignant neoplasm of thyroid: Secondary | ICD-10-CM

## 2022-11-19 DIAGNOSIS — H6992 Unspecified Eustachian tube disorder, left ear: Secondary | ICD-10-CM | POA: Insufficient documentation

## 2022-11-19 DIAGNOSIS — Z1211 Encounter for screening for malignant neoplasm of colon: Secondary | ICD-10-CM

## 2022-11-19 DIAGNOSIS — I10 Essential (primary) hypertension: Secondary | ICD-10-CM | POA: Diagnosis not present

## 2022-11-19 MED ORDER — AMPHETAMINE-DEXTROAMPHET ER 20 MG PO CP24: 20.0000 mg | ORAL_CAPSULE | ORAL | 0 refills | Status: DC

## 2022-11-19 MED ORDER — AMLODIPINE BESYLATE 5 MG PO TABS: 5.0000 mg | ORAL_TABLET | Freq: Every day | ORAL | 0 refills | Status: DC

## 2022-11-19 NOTE — Assessment & Plan Note (Signed)
New diagnosis. Has had 3 readings in the HTN range, two within our system and one with endocrinology. Recommended that he begin amlodipine 5mg  once daily.

## 2022-11-19 NOTE — Progress Notes (Addendum)
Subjective:   By signing my name below, I, Zachary [name], attest that this documentation has been prepared under the direction and in the presence of Zachary Craze, NP. 11/19/2022   Patient ID: Zachary [name], male    DOB: 1976/06/07, 47 y.o.   MRN: [id number]  Chief Complaint  Patient presents with   Hypothyroidism    Here for follow up   ADHD    Here for follow up   Ear Fullness    Complains of left ear fullness     HPI Patient is in today for a follow-up appointment.   Blood pressure: His blood pressure is elevated during this visit. BP Readings from Last 3 Encounters:  11/19/22 (!) 142/88  05/14/22 (!) 140/86  10/23/21 137/88   Pulse Readings from Last 3 Encounters:  11/19/22 71  05/14/22 80  10/23/21 71    Left ear: He has been experiencing fullness in his left ear with no pain associated for the past month.  Adderall: He is taking 10 mg Adderall daily PO and reports that his focus is still improved upon taking it.   Sildenafil: He is taking 20 mg AMB sildenafil as needed.   Colonoscopy: He is interested in getting a referral for a colonoscopy during this  visit.    Past Medical History:  Diagnosis Date   ADD (attention deficit disorder)    History of papillary adenocarcinoma of thyroid    s/p thyroidectomy 06/2003   History of tobacco abuse    Hypertriglyceridemia 06/03/2019   Iatrogenic hyperthyroidism     Past Surgical History:  Procedure Laterality Date   THYROIDECTOMY  06/2003   TONSILLECTOMY  1985    Family History  Problem Relation Age of Onset   Achalasia Father 68   Other Mother 62       healthy   Hypertension Mother    Coronary artery disease Paternal Grandmother    Stroke Maternal Grandfather    Diabetes Neg Hx     Social History   Socioeconomic History   Marital status: Married    Spouse name: Not on file   Number of children: Not on file   Years of education: Not on file   Highest education level: Bachelor's degree  (e.g., BA, AB, BS)  Occupational History   Not on file  Tobacco Use   Smoking status: Former   Smokeless tobacco: Never   Tobacco comments:    Quit  about 7 years ago. 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   Plays soccer, guitar   Social Determinants of Health   Financial Resource Strain: Low Risk  (11/18/2022)   Overall Financial Resource Strain (CARDIA)    Difficulty of Paying Living Expenses: Not hard at all  Food Insecurity: No Food Insecurity (11/18/2022)   Hunger Vital Sign    Worried About Running Out of Food in the Last Year: Never true    Ran Out of Food in the  Last Year: Never true  Transportation Needs: No Transportation Needs (11/18/2022)   PRAPARE - Administrator, Civil Service (Medical): No    Lack of Transportation (Non-Medical): No  Physical Activity: Insufficiently Active (11/18/2022)   Exercise Vital Sign    Days of Exercise per Week: 2 days    Minutes of Exercise per Session: 60 min  Stress: No Stress Concern Present (11/18/2022)   [employer] of Occupational Health - Occupational Stress Questionnaire    Feeling of Stress : Only a little  Social Connections: Unknown (11/18/2022)   Social Connection and Isolation Panel [NHANES]    Frequency of Communication with Friends and Family: Once a week    Frequency of Social Gatherings with Friends and Family: Not on file    Attends Religious Services: Never    Database administrator or Organizations: Yes    Attends Engineer, structural: More than 4 times per year    Marital Status: Married   Catering manager Violence: Not on file    Outpatient Medications Prior to Visit  Medication Sig Dispense Refill   clomiPHENE (CLOMID) 50 MG tablet Take 25 mg by mouth daily.     fluticasone (FLONASE) 50 MCG/ACT nasal spray Place 2 sprays into both nostrils daily. 16 g 6   levothyroxine (SYNTHROID, LEVOTHROID) 200 MCG tablet Take 1 tablet by mouth daily.     Omega-3 Fatty Acids (FISH OIL) 1000 MG CAPS Take 3 capsules (3,000 mg total) by mouth in the morning and at bedtime.  0   sildenafil (REVATIO) 20 MG tablet SMARTSIG:1-5 Tablet(s) By Mouth Daily PRN     amphetamine-dextroamphetamine (ADDERALL XR) 20 MG 24 hr capsule Take 1 capsule (20 mg total) by mouth every morning. 30 capsule 0   benzonatate (TESSALON PERLES) 100 MG capsule Take 1 capsule (100 mg total) by mouth 3 (three) times daily as needed. 20 capsule 0   oseltamivir (TAMIFLU) 75 MG capsule Take 1 capsule (75 mg total) by mouth 2 (two) times daily. 10 capsule 0   No facility-administered medications prior to visit.    No Known Allergies  ROS See HPI    Objective:    Physical Exam Constitutional:      General: He is not in acute distress.    Appearance: Normal appearance.  HENT:     Head: Normocephalic and atraumatic.     Right Ear: Tympanic membrane, ear canal and external ear normal.     Left Ear: Tympanic membrane, ear canal and external ear normal.  Eyes:     Extraocular Movements: Extraocular movements intact.     Pupils: Pupils are equal, round, and reactive to light.  Cardiovascular:     Rate and Rhythm: Normal rate and regular rhythm.     Heart sounds: Normal heart sounds. No murmur heard.    No gallop.     Comments: Manual bp recheck 142/88 Pulmonary:     Effort: Pulmonary effort is normal. No respiratory distress.     Breath sounds: Normal breath sounds. No wheezing or rales.  Musculoskeletal:     Cervical back: Normal range of motion.  Skin:    General: Skin is warm.  Neurological:     Mental  Status: He is alert and oriented to person, place, and time.  Psychiatric:        Judgment: Judgment normal.     BP (!) 142/88   Pulse 71   Temp 98 F (36.7 C) (Oral)   Resp 16  Wt 208 lb (94.3 kg)   SpO2 99%   BMI 29.84 kg/m  Wt Readings from Last 3 Encounters:  11/19/22 208 lb (94.3 kg)  05/14/22 207 lb 9.6 oz (94.2 kg)  10/23/21 208 lb (94.3 kg)       Assessment & Plan:  Attention deficit disorder, unspecified hyperactivity presence Assessment & Plan: Stable on current dose of Adderall.  Continue same.  Obtain UDS, contract updated today.   Orders: -     DRUG MONITORING, PANEL 8 WITH CONFIRMATION, URINE -     Amphetamine-Dextroamphet ER; Take 1 capsule (20 mg total) by mouth every morning.  Dispense: 30 capsule; Refill: 0  Screening for colon cancer -     Ambulatory referral to Gastroenterology  Hypertension, unspecified type Assessment & Plan: New diagnosis. Has had 3 readings in the HTN range, two within our system and one with endocrinology. Recommended that he begin amlodipine 5mg  once daily.    Hypothyroidism, unspecified type Assessment & Plan: Clinically stable on synthroid- management per endocrinology.    THYROID CANCER, HX OF Assessment & Plan: Management per Endo.    Erectile dysfunction, unspecified erectile dysfunction type Assessment & Plan: Stable with PRN use of Sildenafil.    Eustachian tube dysfunction, left Assessment & Plan: New. Recommended that he add flonase and claritin.    Other orders -     amLODIPine Besylate; Take 1 tablet (5 mg total) by mouth daily.  Dispense: 90 tablet; Refill: 0    I, Lemont FillersMelissa S O'Sullivan, NP, personally preformed the services described in this documentation.  All medical record entries made by the scribe were at my direction and in my presence.  I have reviewed the chart and discharge instructions (if applicable) and agree that the record reflects my personal performance and is accurate and complete.  11/19/2022  Lemont FillersMelissa S O'Sullivan, NP   [facility],Kennedy C Lynn,acting as a scribe for Lemont FillersMelissa S O'Sullivan, NP.,have documented all relevant documentation on the behalf of Lemont FillersMelissa S O'Sullivan, NP,as directed by  Lemont FillersMelissa S O'Sullivan, NP while in the presence of Lemont FillersMelissa S O'Sullivan, NP.

## 2022-11-19 NOTE — Assessment & Plan Note (Signed)
Stable on current dose of Adderall.  Continue same.  Obtain UDS, contract updated today.

## 2022-11-19 NOTE — Assessment & Plan Note (Signed)
Stable with PRN use of Sildenafil.

## 2022-11-19 NOTE — Assessment & Plan Note (Signed)
New. Recommended that he add flonase and claritin.

## 2022-11-19 NOTE — Assessment & Plan Note (Addendum)
Clinically stable on synthroid- management per endocrinology.

## 2022-11-19 NOTE — Assessment & Plan Note (Signed)
Management per Endo. 

## 2022-11-21 LAB — DRUG MONITORING, PANEL 8 WITH CONFIRMATION, URINE
6 Acetylmorphine: NEGATIVE ng/mL (ref <10)
Alcohol Metabolites: NEGATIVE ng/mL (ref <500)
Amphetamine: 3799 ng/mL — ABNORMAL HIGH (ref <250)
Amphetamines: POSITIVE ng/mL — AB (ref <500)
Benzodiazepines: NEGATIVE ng/mL (ref <100)
Buprenorphine, Urine: NEGATIVE ng/mL (ref <5)
Cocaine Metabolite: NEGATIVE ng/mL (ref <150)
Creatinine: 94.5 mg/dL (ref >=20.0)
MDMA: NEGATIVE ng/mL (ref <500)
Marijuana Metabolite: NEGATIVE ng/mL (ref <20)
Methamphetamine: NEGATIVE ng/mL (ref <250)
Opiates: NEGATIVE ng/mL (ref <100)
Oxidant: NEGATIVE ug/mL (ref <200)
Oxycodone: NEGATIVE ng/mL (ref <100)
pH: 5.9 (ref 4.5–9.0)

## 2022-11-21 LAB — DM TEMPLATE

## 2022-12-17 ENCOUNTER — Ambulatory Visit: Payer: BC Managed Care – PPO | Admitting: Family

## 2022-12-24 ENCOUNTER — Ambulatory Visit: Payer: BC Managed Care – PPO | Admitting: Family

## 2022-12-24 VITALS — BP 134/88 | HR 64 | Temp 98.2°F | Resp 16 | Wt 207.0 lb

## 2022-12-24 DIAGNOSIS — I10 Essential (primary) hypertension: Secondary | ICD-10-CM | POA: Diagnosis not present

## 2022-12-24 NOTE — Progress Notes (Signed)
Subjective:   By signing my name below, I, Shehryar Baig, attest that this documentation has been prepared under the direction and in the presence of [name], NP. 12/24/2022   Patient ID: Zachary [name], male    DOB: [birth year], 47 y.o.   MRN: [id number]  Chief Complaint  Patient presents with   Hypertension    Here for follow up    Hypertension   Patient is in today for a follow up visit.   Blood pressure: His blood pressure improved during this visit. He did not start amlodipine since last visit but instead started walking at least 2 miles 2x weekly.  BP Readings from Last 3 Encounters:  12/24/22 134/88  11/19/22 (!) 142/88  05/14/22 (!) 140/86   Pulse Readings from Last 3 Encounters:  12/24/22 64  11/19/22 71  05/14/22 80   Adderall: He continues taking 20 mg Adderall XR daily PO and reports no new issues while taking it.   Colonoscopy: He is planning on scheduling an appointment for his colonoscopy after this visit.    Past Medical History:  Diagnosis Date   ADD (attention deficit disorder)    History of papillary adenocarcinoma of thyroid    s/p thyroidectomy 06/2003   History of tobacco abuse    Hypertriglyceridemia 06/03/2019   Iatrogenic hyperthyroidism     Past Surgical History:  Procedure Laterality Date   THYROIDECTOMY  06/2003   TONSILLECTOMY  1985    Family History  Problem Relation Age of Onset   Achalasia Father 72   Other Mother 77       healthy   Hypertension Mother    Coronary artery disease Paternal Grandmother    Stroke Maternal Grandfather    Diabetes Neg Hx     Social History   Socioeconomic History   Marital status: Married    Spouse name: Not on file   Number of children: Not on file   Years of education: Not on file   Highest education level: Bachelor's degree (e.g., BA, AB, BS)  Occupational History   Not on file  Tobacco Use   Smoking status: Former   Smokeless tobacco: Never   Tobacco comments:    Quit   about 7 years ago. 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   Plays soccer, guitar   Social Determinants of Health   Financial Resource Strain: Low Risk  (11/18/2022)   Overall Financial Resource Strain (CARDIA)    Difficulty of Paying Living Expenses: Not hard at all  Food Insecurity: No Food Insecurity (11/18/2022)   Hunger Vital Sign    Worried About Running Out of Food in the Last Year: Never true    Ran Out of Food in the Last Year: Never true  Transportation Needs: No Transportation Needs (11/18/2022)   PRAPARE - Administrator, Civil Service (Medical): No    Lack of Transportation (Non-Medical): No  Physical Activity: Insufficiently Active (11/18/2022)  Exercise Vital Sign    Days of Exercise per Week: 2 days    Minutes of Exercise per Session: 60 min  Stress: No Stress Concern Present (11/18/2022)   [employer] of Occupational Health - Occupational Stress Questionnaire    Feeling of Stress : Only a little  Social Connections: Unknown (11/18/2022)   Social Connection and Isolation Panel [NHANES]    Frequency of Communication with Friends and Family: Once a week    Frequency of Social Gatherings with Friends and Family: Not on file    Attends Religious Services: Never    Database administrator or Organizations: Yes    Attends Engineer, structural: More than 4 times per year    Marital Status: Married  Catering manager Violence: Not on file    Outpatient Medications Prior to Visit  Medication Sig Dispense Refill   amphetamine-dextroamphetamine (ADDERALL XR)  20 MG 24 hr capsule Take 1 capsule (20 mg total) by mouth every morning. 30 capsule 0   clomiPHENE (CLOMID) 50 MG tablet Take 25 mg by mouth daily.     fluticasone (FLONASE) 50 MCG/ACT nasal spray Place 2 sprays into both nostrils daily. 16 g 6   levothyroxine (SYNTHROID, LEVOTHROID) 200 MCG tablet Take 1 tablet by mouth daily.     Omega-3 Fatty Acids (FISH OIL) 1000 MG CAPS Take 3 capsules (3,000 mg total) by mouth in the morning and at bedtime.  0   sildenafil (REVATIO) 20 MG tablet SMARTSIG:1-5 Tablet(s) By Mouth Daily PRN     amLODipine (NORVASC) 5 MG tablet Take 1 tablet (5 mg total) by mouth daily. 90 tablet 0   No facility-administered medications prior to visit.    No Known Allergies  ROS    See HPI Objective:    Physical Exam Constitutional:      General: He is not in acute distress.    Appearance: Normal appearance. He is not ill-appearing.  HENT:     Head: Normocephalic and atraumatic.     Right Ear: External ear normal.     Left Ear: External ear normal.  Eyes:     Extraocular Movements: Extraocular movements intact.     Pupils: Pupils are equal, round, and reactive to light.  Cardiovascular:     Rate and Rhythm: Normal rate and regular rhythm.     Heart sounds: Normal heart sounds. No murmur heard.    No gallop.  Pulmonary:     Effort: Pulmonary effort is normal. No respiratory distress.     Breath sounds: Normal breath sounds. No wheezing or rales.  Skin:    General: Skin is warm and dry.  Neurological:     Mental Status: He is alert and oriented to person, place, and time.  Psychiatric:        Judgment: Judgment normal.     BP 134/88 (BP Location: Left Arm, Patient Position: Sitting, Cuff Size: Large)   Pulse 64   Temp 98.2 F (36.8 C) (Oral)   Resp 16   Wt 207 lb (93.9 kg)   SpO2 98%   BMI 29.70 kg/m  Wt Readings from Last 3 Encounters:  12/24/22 207 lb (93.9 kg)  11/19/22 208 lb (94.3 kg)  05/14/22 207 lb 9.6 oz (94.2 kg)       Assessment  & Plan:  Primary hypertension Assessment & Plan: Pt did not start amlodipine.  He has been trying to add more walking.  BP Readings from Last 3 Encounters:  12/24/22 134/88  11/19/22 Marland Kitchen)  142/88  05/14/22 (!) 140/86   BP is acceptable today off medication. Encouraged weight loss, low sodium diet, exercise. We discussed that if his bp is above goal next visit, we will need for him to start medication. Pt verbalizes understanding.      I, Lemont Fillers, NP, personally preformed the services described in this documentation.  All medical record entries made by the scribe were at my direction and in my presence.  I have reviewed the chart and discharge instructions (if applicable) and agree that the record reflects my personal performance and is accurate and complete. 12/24/2022   I,Shehryar Baig,acting as a scribe for Lemont Fillers, NP.,have documented all relevant documentation on the behalf of Lemont Fillers, NP,as directed by  Lemont Fillers, NP while in the presence of Lemont Fillers, NP.   Lemont Fillers, NP

## 2022-12-24 NOTE — Assessment & Plan Note (Signed)
Pt did not start amlodipine.  He has been trying to add more walking.  BP Readings from Last 3 Encounters:  12/24/22 134/88  11/19/22 (!) 142/88  05/14/22 (!) 140/86   BP is acceptable today off medication. Encouraged weight loss, low sodium diet, exercise. We discussed that if his bp is above goal next visit, we will need for him to start medication. Pt verbalizes understanding.

## 2022-12-28 ENCOUNTER — Other Ambulatory Visit: Payer: Self-pay | Admitting: Family

## 2022-12-28 DIAGNOSIS — F988 Other specified behavioral and emotional disorders with onset usually occurring in childhood and adolescence: Secondary | ICD-10-CM

## 2022-12-28 MED ORDER — AMPHETAMINE-DEXTROAMPHET ER 20 MG PO CP24: 20.0000 mg | ORAL_CAPSULE | ORAL | 0 refills | Status: DC

## 2022-12-28 NOTE — Telephone Encounter (Signed)
Requesting: Adderall Contract: 10/23/2021 UDS: 11/19/2022 Last Visit: 12/24/2022 Next Visit: 03/27/2023 Last Refill: 11/19/2022  Please Advise

## 2023-01-07 DIAGNOSIS — F411 Generalized anxiety disorder: Secondary | ICD-10-CM | POA: Diagnosis not present

## 2023-01-14 DIAGNOSIS — F411 Generalized anxiety disorder: Secondary | ICD-10-CM | POA: Diagnosis not present

## 2023-01-23 ENCOUNTER — Encounter: Payer: Self-pay | Admitting: Family

## 2023-01-23 ENCOUNTER — Other Ambulatory Visit: Payer: Self-pay | Admitting: Family

## 2023-01-23 DIAGNOSIS — F988 Other specified behavioral and emotional disorders with onset usually occurring in childhood and adolescence: Secondary | ICD-10-CM

## 2023-01-23 DIAGNOSIS — F411 Generalized anxiety disorder: Secondary | ICD-10-CM | POA: Diagnosis not present

## 2023-01-23 MED ORDER — AMPHETAMINE-DEXTROAMPHET ER 20 MG PO CP24
20.0000 mg | ORAL_CAPSULE | ORAL | 0 refills | Status: DC
Start: 2023-01-23 — End: 2023-03-01
  Filled 2023-01-23: qty 30, 30d supply, fill #0

## 2023-01-23 NOTE — Telephone Encounter (Signed)
Requesting: Adderall XR 20mg   Contract: 10/23/21 UDS: 11/19/22 Last Visit: 12/24/22 Next Visit: 03/27/23 Last Refill: 12/28/22 #30 and 0RF  Please Advise

## 2023-01-24 ENCOUNTER — Other Ambulatory Visit (HOSPITAL_BASED_OUTPATIENT_CLINIC_OR_DEPARTMENT_OTHER): Payer: Self-pay

## 2023-01-25 DIAGNOSIS — F411 Generalized anxiety disorder: Secondary | ICD-10-CM | POA: Diagnosis not present

## 2023-02-05 DIAGNOSIS — F411 Generalized anxiety disorder: Secondary | ICD-10-CM | POA: Diagnosis not present

## 2023-02-08 DIAGNOSIS — F411 Generalized anxiety disorder: Secondary | ICD-10-CM | POA: Diagnosis not present

## 2023-02-11 DIAGNOSIS — F411 Generalized anxiety disorder: Secondary | ICD-10-CM | POA: Diagnosis not present

## 2023-02-24 ENCOUNTER — Other Ambulatory Visit: Payer: Self-pay | Admitting: Family

## 2023-02-25 DIAGNOSIS — F411 Generalized anxiety disorder: Secondary | ICD-10-CM | POA: Diagnosis not present

## 2023-02-26 ENCOUNTER — Other Ambulatory Visit: Payer: Self-pay | Admitting: Family

## 2023-02-26 DIAGNOSIS — F988 Other specified behavioral and emotional disorders with onset usually occurring in childhood and adolescence: Secondary | ICD-10-CM

## 2023-02-28 ENCOUNTER — Other Ambulatory Visit (HOSPITAL_BASED_OUTPATIENT_CLINIC_OR_DEPARTMENT_OTHER): Payer: Self-pay

## 2023-02-28 ENCOUNTER — Encounter (HOSPITAL_BASED_OUTPATIENT_CLINIC_OR_DEPARTMENT_OTHER): Payer: Self-pay

## 2023-02-28 ENCOUNTER — Other Ambulatory Visit: Payer: Self-pay | Admitting: Family

## 2023-02-28 DIAGNOSIS — F988 Other specified behavioral and emotional disorders with onset usually occurring in childhood and adolescence: Secondary | ICD-10-CM

## 2023-03-01 ENCOUNTER — Telehealth: Payer: Self-pay | Admitting: Family

## 2023-03-01 DIAGNOSIS — F988 Other specified behavioral and emotional disorders with onset usually occurring in childhood and adolescence: Secondary | ICD-10-CM

## 2023-03-01 MED ORDER — AMPHETAMINE-DEXTROAMPHET ER 20 MG PO CP24
20.0000 mg | ORAL_CAPSULE | ORAL | 0 refills | Status: DC
Start: 2023-03-01 — End: 2023-03-27

## 2023-03-01 NOTE — Telephone Encounter (Signed)
Patient called and would like a med refill on amphetamine-dextroamphetamine (ADDERALL XR) 20 MG 24 hr capsule  please send to [facility] on [id number]

## 2023-03-01 NOTE — Addendum Note (Signed)
Addended by: [name] on: 03/01/2023 04:49 PM   Modules accepted: Orders

## 2023-03-04 ENCOUNTER — Encounter: Payer: Self-pay | Admitting: Family

## 2023-03-06 ENCOUNTER — Encounter: Payer: Self-pay | Admitting: Internal Medicine

## 2023-03-07 DIAGNOSIS — F411 Generalized anxiety disorder: Secondary | ICD-10-CM | POA: Diagnosis not present

## 2023-03-13 ENCOUNTER — Encounter (INDEPENDENT_AMBULATORY_CARE_PROVIDER_SITE_OTHER): Payer: Self-pay

## 2023-03-15 DIAGNOSIS — F411 Generalized anxiety disorder: Secondary | ICD-10-CM | POA: Diagnosis not present

## 2023-03-20 DIAGNOSIS — F411 Generalized anxiety disorder: Secondary | ICD-10-CM | POA: Diagnosis not present

## 2023-03-27 ENCOUNTER — Ambulatory Visit: Payer: BC Managed Care – PPO | Admitting: Family

## 2023-03-27 VITALS — BP 130/78 | HR 63 | Temp 98.2°F | Resp 18 | Ht 70.0 in | Wt 203.8 lb

## 2023-03-27 DIAGNOSIS — E291 Testicular hypofunction: Secondary | ICD-10-CM

## 2023-03-27 DIAGNOSIS — I10 Essential (primary) hypertension: Secondary | ICD-10-CM | POA: Diagnosis not present

## 2023-03-27 DIAGNOSIS — E039 Hypothyroidism, unspecified: Secondary | ICD-10-CM

## 2023-03-27 DIAGNOSIS — F988 Other specified behavioral and emotional disorders with onset usually occurring in childhood and adolescence: Secondary | ICD-10-CM | POA: Diagnosis not present

## 2023-03-27 MED ORDER — AMPHETAMINE-DEXTROAMPHET ER 20 MG PO CP24
20.0000 mg | ORAL_CAPSULE | ORAL | 0 refills | Status: DC
Start: 2023-03-27 — End: 2023-05-01

## 2023-03-27 NOTE — Patient Instructions (Signed)
VISIT SUMMARY:  During your recent visit, we discussed your ongoing management of ADHD, hypothyroidism, and hypertension. You reported no issues with your current medications, including Adderall, levothyroxine, and an antihypertensive. We also discussed your efforts to lose weight and your upcoming colonoscopy.  YOUR PLAN:  -ATTENTION DEFICIT HYPERACTIVITY DISORDER (ADHD): Your ADHD is currently stable with your daily dose of Adderall. We discussed potentially reducing the dose, but for now, we will continue with the current dosage.  -HYPERTENSION: Your blood pressure control has improved, likely due to your weight loss efforts. We will continue with your current management and lifestyle modifications.  -HYPOTHYROIDISM: Your hypothyroidism is stable and is being managed by your endocrinologist. Continue taking your levothyroxine as prescribed.  -TESTOSTERONE DEFICIENCY: Your testosterone levels are stable with the use of Clomid, which is being managed by your urologist. Continue taking Clomid as prescribed.  -COLON CANCER SCREENING: You have a colonoscopy scheduled in two weeks. This is an important screening test for colon cancer.  INSTRUCTIONS:  Continue with your diet and exercise regimen for weight loss. Please return in six months for a follow-up visit. At that time, we may consider doing some blood work. Remember to refill your Adderall prescription at [facility] and continue with your scheduled colonoscopy.

## 2023-03-27 NOTE — Assessment & Plan Note (Signed)
Stable on Adderall 20mg . Discussed potential dose reduction, but no immediate changes planned. -Continue Adderall 20mg  daily. -Refill prescription at [facility] on [address]. - controlled substance contract up to date.

## 2023-03-27 NOTE — Assessment & Plan Note (Signed)
  Stable, managed by endocrinology. -Continue Levothyroxine as prescribed by endocrinology.

## 2023-03-27 NOTE — Progress Notes (Signed)
Subjective:     Patient ID: Zachary [name], male    DOB: 1976/07/07, 47 y.o.   MRN: [id number]  Chief Complaint  Patient presents with   Follow-up    3 months    HPI  Discussed the use of AI scribe software for clinical note transcription with the patient, who gave verbal consent to proceed.  History of Present Illness   The patient, with a history of ADHD, hx of Thyroid cancer/hypothyroidism, and hypertension, presents for a medication follow-up. He reports no changes or issues with his current regimen of Adderall, levothyroxine, and an unspecified antihypertensive. He has been considering reducing his Adderall dosage, but has not made any changes yet. He has been managing his ADHD symptoms well with the current dosage and has no desire to increase it.  His hypothyroidism is being managed by an endocrinologist, and there have been no recent changes to his levothyroxine dosage. He is also taking Clomid, prescribed by a urologist.  The patient has been struggling with high blood pressure. He is not on antihypertensives but is making efforts to lose weight, and has seen some success.  He has a colonoscopy scheduled in the near future. He has been trying to increase his physical activity, but finds it challenging due to the hot weather. He has a walking pad at home, which he uses when it's too hot to go outside.      Wt Readings from Last 3 Encounters:  03/27/23 203 lb 12.8 oz (92.4 kg)  12/24/22 207 lb (93.9 kg)  11/19/22 208 lb (94.3 kg)   BP Readings from Last 3 Encounters:  03/27/23 130/78  12/24/22 134/88  11/19/22 (!) 142/88       Health Maintenance Due  Topic Date Due   Colonoscopy  Never done    Past Medical History:  Diagnosis Date   ADD (attention deficit disorder)    History of papillary adenocarcinoma of thyroid    s/p thyroidectomy 06/2003   History of tobacco abuse    Hypertriglyceridemia 06/03/2019   Iatrogenic hyperthyroidism     Past Surgical  History:  Procedure Laterality Date   THYROIDECTOMY  06/2003   TONSILLECTOMY  1985    Family History  Problem Relation Age of Onset   Achalasia Father 42   Other Mother 35       healthy   Hypertension Mother    Coronary artery disease Paternal Grandmother    Stroke Maternal Grandfather    Diabetes Neg Hx     Social History   Socioeconomic History   Marital status: Married    Spouse name: Not on file   Number of children: Not on file   Years of education: Not on file   Highest education level: Bachelor's degree (e.g., BA, AB, BS)  Occupational History   Not on file  Tobacco Use   Smoking status: Former   Smokeless tobacco: Never   Tobacco comments:    Quit  about 7 years ago. 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   Plays soccer, guitar   Social Determinants of Health   Financial Resource Strain: Low Risk  (11/18/2022)   Overall Financial Resource Strain (CARDIA)    Difficulty of Paying Living Expenses: Not hard at all  Food Insecurity: No Food Insecurity (11/18/2022)   Hunger Vital Sign    Worried About Running Out of Food in the Last Year: Never true    Ran Out of Food in the Last Year: Never true  Transportation Needs: No Transportation Needs (11/18/2022)   PRAPARE - Administrator, Civil Service (Medical): No    Lack of Transportation (Non-Medical): No  Physical Activity: Insufficiently Active (11/18/2022)   Exercise Vital Sign    Days of Exercise per Week: 2 days    Minutes of Exercise per Session: 60 min  Stress: No Stress Concern Present (11/18/2022)    [employer] of Occupational Health - Occupational Stress Questionnaire    Feeling of Stress : Only a little  Social Connections: Unknown (11/18/2022)   Social Connection and Isolation Panel [NHANES]    Frequency of Communication with Friends and Family: Once a week    Frequency of Social Gatherings with Friends and Family: Not on file    Attends Religious Services: Never    Database administrator or Organizations: Yes    Attends Engineer, structural: More than 4 times per year    Marital Status: Married  Catering manager Violence: Not on file    Outpatient Medications Prior to Visit  Medication Sig Dispense Refill   clomiPHENE (CLOMID) 50 MG tablet Take 25 mg by mouth daily.     levothyroxine (SYNTHROID, LEVOTHROID) 200 MCG tablet Take 1 tablet by mouth daily.     Omega-3 Fatty Acids (FISH OIL) 1000 MG CAPS Take 3 capsules (3,000 mg total) by mouth in the morning and at bedtime.  0   sildenafil (REVATIO) 20 MG tablet SMARTSIG:1-5 Tablet(s) By Mouth Daily PRN     amphetamine-dextroamphetamine (ADDERALL XR) 20 MG 24 hr capsule Take 1 capsule (20 mg total) by mouth every morning. 30 capsule 0   No facility-administered medications prior to visit.    No Known Allergies  ROS     Objective:    Physical Exam Constitutional:      General: He is not in acute distress.    Appearance: He is well-developed.  HENT:     Head: Normocephalic and atraumatic.  Cardiovascular:     Rate and Rhythm: Normal rate and regular rhythm.     Heart sounds: No murmur heard. Pulmonary:     Effort: Pulmonary effort is normal. No respiratory distress.     Breath sounds: Normal breath sounds. No wheezing or rales.  Skin:    General: Skin is warm and dry.  Neurological:     Mental Status: He is alert and oriented to person, place, and time.  Psychiatric:        Behavior: Behavior normal.        Thought Content: Thought content normal.      BP 130/78   Pulse 63   Temp 98.2 F (36.8  C)   Resp 18   Ht 5\' 10"  (1.778 m)   Wt 203 lb 12.8 oz (92.4 kg)   SpO2 100%   BMI 29.24 kg/m  Wt Readings from Last 3 Encounters:  03/27/23 203 lb 12.8 oz (92.4 kg)  12/24/22 207 lb (93.9 kg)  11/19/22 208 lb (94.3 kg)  Assessment & Plan:   Problem List Items Addressed This Visit       Unprioritized   Hypothyroidism     Stable, managed by endocrinology. -Continue Levothyroxine as prescribed by endocrinology.       Hypogonadism in male     Stable on Clomid, managed by urology. -Continue Clomid as prescribed by urology.      Hypertension - Primary    Improved BP control (130/72 today). Noted weight loss. -Continue current management and lifestyle modifications.      Attention deficit disorder    Stable on Adderall 20mg . Discussed potential dose reduction, but no immediate changes planned. -Continue Adderall 20mg  daily. -Refill prescription at [facility] on [address]. - controlled substance contract up to date.       Relevant Medications   amphetamine-dextroamphetamine (ADDERALL XR) 20 MG 24 hr capsule    I am having Avyn B. [employer]" maintain his clomiPHENE, levothyroxine, Fish Oil, sildenafil, and amphetamine-dextroamphetamine.  Meds ordered this encounter  Medications   amphetamine-dextroamphetamine (ADDERALL XR) 20 MG 24 hr capsule    Sig: Take 1 capsule (20 mg total) by mouth every morning.    Dispense:  30 capsule    Refill:  0    Order Specific Question:   Supervising Provider    Answer:   Danise Edge A [[id number]]

## 2023-03-27 NOTE — Assessment & Plan Note (Signed)
  Stable on Clomid, managed by urology. -Continue Clomid as prescribed by urology.

## 2023-03-27 NOTE — Assessment & Plan Note (Signed)
Improved BP control (130/72 today). Noted weight loss. -Continue current management and lifestyle modifications.

## 2023-03-28 ENCOUNTER — Ambulatory Visit (AMBULATORY_SURGERY_CENTER): Payer: BC Managed Care – PPO

## 2023-03-28 ENCOUNTER — Encounter: Payer: Self-pay | Admitting: Internal Medicine

## 2023-03-28 VITALS — Ht 70.0 in | Wt 203.0 lb

## 2023-03-28 DIAGNOSIS — Z1211 Encounter for screening for malignant neoplasm of colon: Secondary | ICD-10-CM

## 2023-03-28 MED ORDER — NA SULFATE-K SULFATE-MG SULF 17.5-3.13-1.6 GM/177ML PO SOLN: 1.0000 | Freq: Once | ORAL | 0 refills | Status: AC

## 2023-03-28 NOTE — Progress Notes (Signed)
 No egg or soy allergy known to patient  No issues known to pt with past sedation with any surgeries or procedures Patient denies ever being told they had issues or difficulty with intubation  No FH of Malignant Hyperthermia Pt is not on diet pills Pt is not on  home 02  Pt is not on blood thinners  Pt denies issues with chronic constipation  No A fib or A flutter Have any cardiac testing pending--no Patient's chart reviewed by Cathlyn Parsons CNRA prior to previsit and patient appropriate for the [facility].  Previsit completed and red dot placed by patient's name on their procedure day (on provider's schedule).   Ambulates independently Pt instructed to use [email or web address] or GoodRx for a price reduction on prep

## 2023-04-01 ENCOUNTER — Telehealth: Payer: Self-pay | Admitting: Internal Medicine

## 2023-04-01 NOTE — Telephone Encounter (Signed)
Patient rescheduled for procedure. Please issue new prep instructions.   Thank you

## 2023-04-01 NOTE — Telephone Encounter (Signed)
Revised instructions sent to patient via MyChart as well as printed and mailed to the patient to the address listed in EPIC;

## 2023-04-11 ENCOUNTER — Encounter: Payer: BC Managed Care – PPO | Admitting: Internal Medicine

## 2023-04-27 ENCOUNTER — Encounter: Payer: Self-pay | Admitting: Certified Registered Nurse Anesthetist

## 2023-05-01 ENCOUNTER — Other Ambulatory Visit: Payer: Self-pay | Admitting: Family

## 2023-05-01 DIAGNOSIS — F988 Other specified behavioral and emotional disorders with onset usually occurring in childhood and adolescence: Secondary | ICD-10-CM

## 2023-05-01 MED ORDER — AMPHETAMINE-DEXTROAMPHET ER 20 MG PO CP24
20.0000 mg | ORAL_CAPSULE | ORAL | 0 refills | Status: DC
Start: 2023-05-01 — End: 2023-05-02

## 2023-05-01 NOTE — Telephone Encounter (Signed)
Requesting: Adderall XR 20mg  Contract: 12/24/2022 UDS: 11/19/2022 Last Visit: 03/27/2023 Next Visit: 09/27/2023 Last Refill: 03/27/2023  Please Advise

## 2023-05-02 ENCOUNTER — Encounter: Payer: Self-pay | Admitting: Internal Medicine

## 2023-05-02 ENCOUNTER — Other Ambulatory Visit (HOSPITAL_BASED_OUTPATIENT_CLINIC_OR_DEPARTMENT_OTHER): Payer: Self-pay

## 2023-05-02 ENCOUNTER — Encounter: Payer: Self-pay | Admitting: Family

## 2023-05-02 ENCOUNTER — Ambulatory Visit: Payer: BC Managed Care – PPO | Admitting: Internal Medicine

## 2023-05-02 VITALS — BP 119/73 | HR 52 | Temp 98.4°F | Resp 15 | Ht 70.0 in | Wt 203.0 lb

## 2023-05-02 DIAGNOSIS — D123 Benign neoplasm of transverse colon: Secondary | ICD-10-CM

## 2023-05-02 DIAGNOSIS — D124 Benign neoplasm of descending colon: Secondary | ICD-10-CM | POA: Diagnosis not present

## 2023-05-02 DIAGNOSIS — D120 Benign neoplasm of cecum: Secondary | ICD-10-CM | POA: Diagnosis not present

## 2023-05-02 DIAGNOSIS — Z1211 Encounter for screening for malignant neoplasm of colon: Secondary | ICD-10-CM

## 2023-05-02 DIAGNOSIS — F988 Other specified behavioral and emotional disorders with onset usually occurring in childhood and adolescence: Secondary | ICD-10-CM

## 2023-05-02 MED ORDER — SODIUM CHLORIDE 0.9 % IV SOLN: 500.0000 mL | Freq: Once | INTRAVENOUS | Status: DC

## 2023-05-02 MED ORDER — AMPHETAMINE-DEXTROAMPHET ER 20 MG PO CP24
20.0000 mg | ORAL_CAPSULE | ORAL | 0 refills | Status: DC
  Filled 2023-05-02: qty 30, 30d supply, fill #0

## 2023-05-02 NOTE — Progress Notes (Signed)
Called to room to assist during endoscopic procedure.  Patient ID and intended procedure confirmed with present staff. Received instructions for my participation in the procedure from the performing physician.  

## 2023-05-02 NOTE — Patient Instructions (Signed)

## 2023-05-02 NOTE — Op Note (Signed)
[facility] Patient Name: Zachary [name] Procedure Date: 05/02/2023 8:44 AM MRN: [id number] Endoscopist: Madelyn Brunner [place] , , [id number] Age: 47 Referring MD:  Date of Birth: 1975/08/22 Gender: Male Account #: [id number] Procedure:                Colonoscopy Indications:              Screening for colorectal malignant neoplasm, This                            is the patient's first colonoscopy Medicines:                Monitored Anesthesia Care Procedure:                Pre-Anesthesia Assessment:                           - Prior to the procedure, a History and Physical                            was performed, and patient medications and                            allergies were reviewed. The patient's tolerance of                            previous anesthesia was also reviewed. The risks                            and benefits of the procedure and the sedation                            options and risks were discussed with the patient.                            All questions were answered, and informed consent                            was obtained. Prior Anticoagulants: The patient has                            taken no anticoagulant or antiplatelet agents. ASA                            Grade Assessment: II - A patient with mild systemic                            disease. After reviewing the risks and benefits,                            the patient was deemed in satisfactory condition to                            undergo the procedure.  After obtaining informed consent, the colonoscope                            was passed under direct vision. Throughout the                            procedure, the patient's blood pressure, pulse, and                            oxygen saturations were monitored continuously. The                            CF [id number] #[id number] was introduced through the anus                            and advanced to the  the terminal ileum. The                            colonoscopy was performed without difficulty. The                            patient tolerated the procedure well. The quality                            of the bowel preparation was excellent. The                            terminal ileum, ileocecal valve, appendiceal                            orifice, and rectum were photographed. Scope In: 9:01:47 AM Scope Out: 9:15:16 AM Scope Withdrawal Time: 0 hours 9 minutes 53 seconds  Total Procedure Duration: 0 hours 13 minutes 29 seconds  Findings:                 The terminal ileum appeared normal.                           Three sessile polyps were found in the descending                            colon, transverse colon and cecum. The polyps were                            3 to 10 mm in size. These polyps were removed with                            a cold snare. Resection and retrieval were complete.                           Non-bleeding internal hemorrhoids were found during                            retroflexion. Complications:  No immediate complications. Estimated Blood Loss:     Estimated blood loss was minimal. Impression:               - The examined portion of the ileum was normal.                           - Three 3 to 10 mm polyps in the descending colon,                            in the transverse colon and in the cecum, removed                            with a cold snare. Resected and retrieved.                           - Non-bleeding internal hemorrhoids. Recommendation:           - Discharge patient to home (with escort).                           - Await pathology results.                           - The findings and recommendations were discussed                            with the patient. Dr Particia Lather "Alan Ripper" Leonides Schanz,  05/02/2023 9:19:28 AM

## 2023-05-02 NOTE — Progress Notes (Signed)
Report given to PACU, vss 

## 2023-05-02 NOTE — Telephone Encounter (Signed)
Please cancel adderall xr at [facility].

## 2023-05-02 NOTE — Progress Notes (Signed)
Pt's states no medical or surgical changes since previsit or office visit. 

## 2023-05-02 NOTE — Progress Notes (Signed)
GASTROENTEROLOGY PROCEDURE H&P NOTE   Primary Care Physician: [name], NP    Reason for Procedure:   Colon cancer screening  Plan:    Colonoscopy  Patient is appropriate for endoscopic procedure(s) in the ambulatory ([facility]) setting.  The nature of the procedure, as well as the risks, benefits, and alternatives were carefully and thoroughly reviewed with the patient. Ample time for discussion and questions allowed. The patient understood, was satisfied, and agreed to proceed.     HPI: Zachary [name] is a 47 y.o. male who presents for colonoscopy for colon cancer screening. Denies blood in stools, changes in bowel habits, or unintentional weight loss. Denies family history of colon cancer.  Past Medical History:  Diagnosis Date   ADD (attention deficit disorder)    History of papillary adenocarcinoma of thyroid    s/p thyroidectomy 06/2003   History of tobacco abuse    Hypertriglyceridemia 06/03/2019   Iatrogenic hyperthyroidism     Past Surgical History:  Procedure Laterality Date   THYROIDECTOMY  06/2003   TONSILLECTOMY  1985    Prior to Admission medications   Medication Sig Start Date End Date Taking? Authorizing Provider  amphetamine-dextroamphetamine (ADDERALL XR) 20 MG 24 hr capsule Take 1 capsule (20 mg total) by mouth every morning. 05/01/23  Yes [name], NP  clomiPHENE (CLOMID) 50 MG tablet Take 25 mg by mouth every other day. 07/02/14  Yes [provider]  levothyroxine (SYNTHROID, LEVOTHROID) 200 MCG tablet Take 175 mcg by mouth daily. 10/11/17  Yes [provider]  Omega-3 Fatty Acids (FISH OIL) 1000 MG CAPS Take 3 capsules (3,000 mg total) by mouth in the morning and at bedtime. 05/04/21  Yes [name], NP  sildenafil (REVATIO) 20 MG tablet SMARTSIG:1-5 Tablet(s) By Mouth Daily PRN 08/14/21   [provider]    Current Outpatient Medications  Medication Sig Dispense Refill    amphetamine-dextroamphetamine (ADDERALL XR) 20 MG 24 hr capsule Take 1 capsule (20 mg total) by mouth every morning. 30 capsule 0   clomiPHENE (CLOMID) 50 MG tablet Take 25 mg by mouth every other day.     levothyroxine (SYNTHROID, LEVOTHROID) 200 MCG tablet Take 175 mcg by mouth daily.     Omega-3 Fatty Acids (FISH OIL) 1000 MG CAPS Take 3 capsules (3,000 mg total) by mouth in the morning and at bedtime.  0   sildenafil (REVATIO) 20 MG tablet SMARTSIG:1-5 Tablet(s) By Mouth Daily PRN     Current Facility-Administered Medications  Medication Dose Route Frequency Provider Last Rate Last Admin   0.9 %  sodium chloride infusion  500 mL Intravenous Once Imogene Burn, MD        Allergies as of 05/02/2023   (No Known Allergies)    Family History  Problem Relation Age of Onset   Other Mother 18       healthy   Hypertension Mother    Achalasia Father 68   Stroke Maternal Grandfather    Coronary artery disease Paternal Grandmother    Diabetes Neg Hx    Colon cancer Neg Hx    Colon polyps Neg Hx    Esophageal cancer Neg Hx    Rectal cancer Neg Hx    Stomach cancer Neg Hx     Social History   Socioeconomic History   Marital status: Married    Spouse name: Not on file   Number of children: Not on file   Years of education: Not on file   Highest education level:  Bachelor's degree (e.g., BA, AB, BS)  Occupational History   Not on file  Tobacco Use   Smoking status: Former   Smokeless tobacco: Never   Tobacco comments:    Quit  about 7 years ago. 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   Plays soccer, guitar   Social Determinants of Health   Financial Resource Strain: Low Risk  (11/18/2022)   Overall Financial Resource Strain (CARDIA)    Difficulty of Paying Living Expenses: Not hard at all  Food Insecurity: No Food Insecurity (11/18/2022)   Hunger Vital Sign    Worried About Running Out of Food in the Last Year: Never true    Ran Out of Food in the Last Year: Never true  Transportation Needs: No Transportation Needs (11/18/2022)   PRAPARE - Administrator, Civil Service (Medical): No    Lack of Transportation (Non-Medical): No  Physical Activity: Insufficiently Active (11/18/2022)   Exercise Vital Sign    Days of Exercise per Week: 2 days    Minutes of Exercise per Session: 60 min  Stress: No Stress Concern Present (11/18/2022)   [employer] of Occupational Health - Occupational Stress Questionnaire    Feeling of Stress : Only a little  Social Connections: Unknown (11/18/2022)   Social Connection and Isolation Panel [NHANES]    Frequency of Communication with Friends and Family: Once a week    Frequency of Social Gatherings with Friends and Family: Not on file    Attends Religious Services: Never    Database administrator or Organizations: Yes    Attends Engineer, structural: More than 4 times per year    Marital Status: Married  Catering manager Violence: Not on file    Physical Exam: Vital signs in last 24 hours: BP (!) 142/74   Pulse 64   Temp 98.4 F (36.9 C)   Ht 5\' 10"  (1.778 m)   Wt 203 lb (92.1 kg)   SpO2 99%   BMI 29.13 kg/m  GEN: NAD EYE: Sclerae anicteric ENT: MMM CV: Non-tachycardic Pulm: No increased work of breathing GI: Soft, NT/ND NEURO:  Alert & Oriented   Eulah Pont, MD [facility]  05/02/2023 8:37 AM

## 2023-05-06 ENCOUNTER — Telehealth: Payer: Self-pay

## 2023-05-06 NOTE — Telephone Encounter (Signed)
Attempted f/u call. No answer, left VM. 

## 2023-05-07 ENCOUNTER — Encounter: Payer: Self-pay | Admitting: Internal Medicine

## 2023-05-07 LAB — SURGICAL PATHOLOGY

## 2023-05-16 DIAGNOSIS — F411 Generalized anxiety disorder: Secondary | ICD-10-CM | POA: Diagnosis not present

## 2023-05-21 DIAGNOSIS — F411 Generalized anxiety disorder: Secondary | ICD-10-CM | POA: Diagnosis not present

## 2023-05-31 ENCOUNTER — Other Ambulatory Visit: Payer: Self-pay | Admitting: Family

## 2023-05-31 DIAGNOSIS — F988 Other specified behavioral and emotional disorders with onset usually occurring in childhood and adolescence: Secondary | ICD-10-CM

## 2023-05-31 DIAGNOSIS — F909 Attention-deficit hyperactivity disorder, unspecified type: Secondary | ICD-10-CM

## 2023-05-31 NOTE — Telephone Encounter (Signed)
Requesting: Adderall XR 20mg   Contract: 01/08/23 UDS: 11/19/22 Last Visit: 03/27/23 Next Visit:  09/27/23 Last Refill: 05/02/23 #30 and 0RF   Please Advise

## 2023-06-02 MED ORDER — AMPHETAMINE-DEXTROAMPHET ER 20 MG PO CP24
20.0000 mg | ORAL_CAPSULE | ORAL | 0 refills | Status: DC
  Filled 2023-06-02: qty 30, 30d supply, fill #0

## 2023-06-03 ENCOUNTER — Other Ambulatory Visit (HOSPITAL_BASED_OUTPATIENT_CLINIC_OR_DEPARTMENT_OTHER): Payer: Self-pay

## 2023-07-02 ENCOUNTER — Other Ambulatory Visit: Payer: Self-pay | Admitting: Family

## 2023-07-02 ENCOUNTER — Other Ambulatory Visit (HOSPITAL_BASED_OUTPATIENT_CLINIC_OR_DEPARTMENT_OTHER): Payer: Self-pay

## 2023-07-02 DIAGNOSIS — F909 Attention-deficit hyperactivity disorder, unspecified type: Secondary | ICD-10-CM

## 2023-07-02 MED ORDER — AMPHETAMINE-DEXTROAMPHET ER 20 MG PO CP24
20.0000 mg | ORAL_CAPSULE | ORAL | 0 refills | Status: DC
  Filled 2023-07-02: qty 30, 30d supply, fill #0

## 2023-07-02 NOTE — Telephone Encounter (Signed)
Requesting: Adderall XR 20mg   Contract: 01/08/23 UDS: 11/19/22  Last Visit: 03/27/23 Next Visit: 09/27/23 Last Refill: 06/02/23 #30 and 0RF    Please Advise

## 2023-07-31 ENCOUNTER — Other Ambulatory Visit (HOSPITAL_BASED_OUTPATIENT_CLINIC_OR_DEPARTMENT_OTHER): Payer: Self-pay

## 2023-07-31 ENCOUNTER — Other Ambulatory Visit: Payer: Self-pay | Admitting: Family

## 2023-07-31 DIAGNOSIS — F909 Attention-deficit hyperactivity disorder, unspecified type: Secondary | ICD-10-CM

## 2023-07-31 MED ORDER — AMPHETAMINE-DEXTROAMPHET ER 20 MG PO CP24
20.0000 mg | ORAL_CAPSULE | ORAL | 0 refills | Status: DC
  Filled 2023-07-31: qty 30, 30d supply, fill #0

## 2023-08-28 ENCOUNTER — Other Ambulatory Visit (HOSPITAL_BASED_OUTPATIENT_CLINIC_OR_DEPARTMENT_OTHER): Payer: Self-pay

## 2023-08-28 ENCOUNTER — Other Ambulatory Visit: Payer: Self-pay | Admitting: Family

## 2023-08-28 DIAGNOSIS — F909 Attention-deficit hyperactivity disorder, unspecified type: Secondary | ICD-10-CM

## 2023-08-28 MED ORDER — AMPHETAMINE-DEXTROAMPHET ER 20 MG PO CP24
20.0000 mg | ORAL_CAPSULE | ORAL | 0 refills | Status: DC
  Filled 2023-08-28: qty 30, 30d supply, fill #0

## 2023-08-28 NOTE — Telephone Encounter (Signed)
 Requesting: Adderall XR 20mg   Contract:11/19/22 UDS:11/19/22 Last Visit: 03/27/23 Next Visit: 10/07/23 Last Refill: 07/31/23 #30 and 0RF   Please Advise

## 2023-09-06 DIAGNOSIS — F411 Generalized anxiety disorder: Secondary | ICD-10-CM | POA: Diagnosis not present

## 2023-09-09 DIAGNOSIS — F411 Generalized anxiety disorder: Secondary | ICD-10-CM | POA: Diagnosis not present

## 2023-09-16 DIAGNOSIS — F411 Generalized anxiety disorder: Secondary | ICD-10-CM | POA: Diagnosis not present

## 2023-09-23 DIAGNOSIS — F411 Generalized anxiety disorder: Secondary | ICD-10-CM | POA: Diagnosis not present

## 2023-09-27 ENCOUNTER — Ambulatory Visit: Payer: BC Managed Care – PPO | Admitting: Family

## 2023-09-30 ENCOUNTER — Other Ambulatory Visit (HOSPITAL_BASED_OUTPATIENT_CLINIC_OR_DEPARTMENT_OTHER): Payer: Self-pay

## 2023-09-30 ENCOUNTER — Other Ambulatory Visit: Payer: Self-pay | Admitting: Family

## 2023-09-30 ENCOUNTER — Other Ambulatory Visit (HOSPITAL_COMMUNITY): Payer: Self-pay

## 2023-09-30 DIAGNOSIS — F909 Attention-deficit hyperactivity disorder, unspecified type: Secondary | ICD-10-CM

## 2023-09-30 MED ORDER — AMPHETAMINE-DEXTROAMPHET ER 20 MG PO CP24
20.0000 mg | ORAL_CAPSULE | ORAL | 0 refills | Status: DC
  Filled 2023-09-30: qty 30, 30d supply, fill #0

## 2023-10-02 DIAGNOSIS — F411 Generalized anxiety disorder: Secondary | ICD-10-CM | POA: Diagnosis not present

## 2023-10-07 ENCOUNTER — Ambulatory Visit: Payer: BC Managed Care – PPO | Admitting: Family

## 2023-10-07 ENCOUNTER — Ambulatory Visit (HOSPITAL_BASED_OUTPATIENT_CLINIC_OR_DEPARTMENT_OTHER)
Admission: RE | Admit: 2023-10-07 | Discharge: 2023-10-07 | Disposition: A | Discharge status: Home / Self Care | Payer: BC Managed Care – PPO | Source: Ambulatory Visit | Attending: Family | Admitting: Family

## 2023-10-07 VITALS — BP 132/82 | HR 63 | Temp 98.6°F | Resp 16 | Ht 70.0 in | Wt 210.0 lb

## 2023-10-07 DIAGNOSIS — F909 Attention-deficit hyperactivity disorder, unspecified type: Secondary | ICD-10-CM | POA: Diagnosis not present

## 2023-10-07 DIAGNOSIS — S99912A Unspecified injury of left ankle, initial encounter: Secondary | ICD-10-CM | POA: Diagnosis not present

## 2023-10-07 DIAGNOSIS — E291 Testicular hypofunction: Secondary | ICD-10-CM

## 2023-10-07 DIAGNOSIS — M25572 Pain in left ankle and joints of left foot: Secondary | ICD-10-CM | POA: Diagnosis not present

## 2023-10-07 DIAGNOSIS — Z85850 Personal history of malignant neoplasm of thyroid: Secondary | ICD-10-CM | POA: Diagnosis not present

## 2023-10-07 NOTE — Assessment & Plan Note (Signed)
  History of injury in October with intermittent severe sharp pain. Initial swelling has improved. -Order X-ray of left ankle today to rule out fracture. -Consider referral to sports medicine if symptoms persist.

## 2023-10-07 NOTE — Patient Instructions (Signed)
 VISIT SUMMARY:  Today, we reviewed your current medications and discussed your ongoing health concerns. You reported good focus with your ADHD medication but noted a decrease in focus in the mid-afternoon. We also addressed your left ankle pain following an injury in October, your ongoing treatment for hypothyroidism, and your management of hypogonadism.  YOUR PLAN:  -ADHD: Attention Deficit Hyperactivity Disorder (ADHD) is a condition characterized by symptoms of inattention, hyperactivity, and impulsivity. You are experiencing good focus with your current dose of Adderall XR, but you notice a decrease in focus in the mid-afternoon. We will continue your current Adderall XR regimen.  -LEFT ANKLE PAIN: You have been experiencing intermittent severe sharp pain in your left ankle following an injury in October. We will order an X-ray of your left ankle today to rule out any fractures. If the symptoms persist, we may consider referring you to a sports medicine specialist.  -HYPOTHYROIDISM/HISTORY OF THYROID CANCER: Hypothyroidism is a condition where the thyroid gland does not produce enough thyroid hormone. You are currently managing this with Synthroid under the care of Dr. Allena Katz, your endocrinologist. We will continue your current Synthroid regimen under Dr. Eliane Decree supervision.  -HYPOGONADISM: Hypogonadism is a condition where the body does not produce enough testosterone. You are currently prescribed Clomid by [facility]. You may consider asking Dr. Allena Katz to take over this prescription. We will continue your current Clomid regimen under the care of [facility] or Dr. Allena Katz, depending on your preference.  INSTRUCTIONS:  Please proceed with the X-ray of your left ankle today. If your ankle pain persists, we may refer you to a sports medicine specialist. Continue your current medications as prescribed. Follow up with Dr. Allena Katz for your thyroid management and consider discussing your  hypogonadism treatment with him as well.

## 2023-10-07 NOTE — Progress Notes (Signed)
 Subjective:     Patient ID: Zachary [name], male    DOB: 1976/07/08, 48 y.o.   MRN: [id number]  Chief Complaint  Patient presents with   Hypertension    Here for follow up   ADHD    Here for follow up    HPI  Discussed the use of AI scribe software for clinical note transcription with the patient, who gave verbal consent to proceed.  History of Present Illness   Zachary [name] "[name]" is a 48 year old male who presents for follow-up.  He continues to take Adderall XR for ADHD, noting good focus on his current dose. However, he experiences a decrease in focus in the mid-afternoon. He takes his first dose at 6 AM as he starts his day early.  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 Initially, there was significant swelling, which has since improved, but he intermittently experiences severe sharp pain in the left ankle. He has not sought evaluation for this pain until now.  He continues to follow with an endocrinologist for dosing of his Synthroid due to a history of thyroid cancer.  He is under the care of a urology group for hypogonadism and is prescribed Clomid. It has been some time since his last visit with them.     Patient is a 48 yr old male     Health Maintenance Due  Topic Date Due   HIV Screening  Never done    Past Medical History:  Diagnosis Date   ADD (attention deficit disorder)    History of papillary adenocarcinoma of thyroid    s/p thyroidectomy 06/2003   History of tobacco abuse    Hypertriglyceridemia 06/03/2019   Iatrogenic hyperthyroidism     Past Surgical History:  Procedure Laterality Date   THYROIDECTOMY  06/2003   TONSILLECTOMY  1985    Family History  Problem Relation Age of Onset   Other Mother 22       healthy   Hypertension Mother    Achalasia Father 35   Stroke Maternal Grandfather    Coronary artery disease Paternal Grandmother    Diabetes Neg Hx    Colon  cancer Neg Hx    Colon polyps Neg Hx    Esophageal cancer Neg Hx    Rectal cancer Neg Hx    Stomach cancer Neg Hx     Social History   Socioeconomic History   Marital status: Married    Spouse name: Not on file   Number of children: Not on file   Years of education: Not on file   Highest education level: Bachelor's degree (e.g., BA, AB, BS)  Occupational History   Not on file  Tobacco Use   Smoking status: Former   Smokeless tobacco: Never   Tobacco comments:    Quit  about 7 years ago. 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   Plays soccer, guitar   Social Drivers of Health   Financial Resource Strain: Low Risk  (10/07/2023)   Overall Financial Resource Strain (CARDIA)    Difficulty of Paying Living Expenses: Not hard at all  Food Insecurity: No Food Insecurity (10/07/2023)   Hunger Vital Sign    Worried About Running Out of Food in the Last Year: Never true    Ran Out of Food in the Last Year: Never true  Transportation Needs: No Transportation Needs (10/07/2023)   PRAPARE - Administrator, Civil Service (Medical): No    Lack of Transportation (Non-Medical): No  Physical Activity: Insufficiently Active (10/07/2023)   Exercise Vital Sign    Days of Exercise per Week: 2 days    Minutes of Exercise per Session: 30 min  Stress: No Stress Concern Present (10/07/2023)   [employer] of Occupational Health - Occupational Stress Questionnaire    Feeling of Stress : Only a little  Social Connections: Socially Integrated  (10/07/2023)   Social Connection and Isolation Panel [NHANES]    Frequency of Communication with Friends and Family: Twice a week    Frequency of Social Gatherings with Friends and Family: Once a week    Attends Religious Services: More than 4 times per year    Active Member of [employer] or Organizations: Yes    Attends Engineer, structural: More than 4 times per year    Marital Status: Married  Catering manager Violence: Not on file    Outpatient Medications Prior to Visit  Medication Sig Dispense Refill   amphetamine-dextroamphetamine (ADDERALL XR) 20 MG 24 hr capsule Take 1 capsule (20 mg total) by mouth every morning. 30 capsule 0   clomiPHENE (CLOMID) 50 MG tablet Take 25 mg by mouth every other day.     levothyroxine (SYNTHROID, LEVOTHROID) 200 MCG tablet Take 175 mcg by mouth daily.     Omega-3 Fatty Acids (FISH OIL) 1000 MG CAPS Take 3 capsules (3,000 mg total) by mouth in the morning and at bedtime.  0   sildenafil (REVATIO) 20 MG tablet SMARTSIG:1-5 Tablet(s) By Mouth Daily PRN     No facility-administered medications prior to visit.    No Known Allergies  ROS    See HPI Objective:    Physical Exam Constitutional:      General: He is not in acute distress.    Appearance: He is well-developed.  HENT:     Head: Normocephalic and atraumatic.  Cardiovascular:     Rate and Rhythm: Normal rate and regular rhythm.     Heart sounds: No murmur heard. Pulmonary:     Effort: Pulmonary effort is normal. No respiratory distress.     Breath sounds: Normal breath sounds. No wheezing or rales.  Skin:    General: Skin is warm and dry.  Neurological:     Mental Status: He is alert and oriented to person, place, and time.  Psychiatric:        Behavior: Behavior normal.        Thought Content: Thought content normal.      BP 132/82 (BP Location: Right Arm, Patient Position: Sitting, Cuff Size: Large)   Pulse 63   Temp 98.6 F (37 C) (Oral)   Resp 16   Ht 5\' 10"   (1.778 m)   Wt 210 lb (95.3 kg)   SpO2 98%   BMI 30.13 kg/m  Wt Readings from Last 3 Encounters:  10/07/23 210 lb (  95.3 kg)  05/02/23 203 lb (92.1 kg)  03/28/23 203 lb (92.1 kg)       Assessment & Plan:   Problem List Items Addressed This Visit       Unprioritized   THYROID CANCER, HX OF    Continues Synthroid under the care of Dr. Allena Katz, endocrinologist. -Continue current Synthroid regimen under the care of Dr. Allena Katz.      Injury of left ankle    History of injury in October with intermittent severe sharp pain. Initial swelling has improved. -Order X-ray of left ankle today to rule out fracture. -Consider referral to sports medicine if symptoms persist.      Relevant Orders   DG Ankle Complete Left   Hypogonadism in male    Currently prescribed Clomid by [facility]. Patient considering asking Dr. Allena Katz to take over prescription. -Continue current Clomid regimen under the care of [facility] or Dr. Allena Katz as per patient's preference.      Attention deficit disorder - Primary   Stable on adderall xr.  Contract updated. Update UDS.      Relevant Orders   DRUG MONITORING, PANEL 8 WITH CONFIRMATION, URINE    I am having Samanyu B. [employer]" maintain his clomiPHENE, levothyroxine, Fish Oil, sildenafil, and amphetamine-dextroamphetamine.  No orders of the defined types were placed in this encounter.

## 2023-10-07 NOTE — Assessment & Plan Note (Signed)
  Continues Synthroid under the care of Dr. Allena Katz, endocrinologist. -Continue current Synthroid regimen under the care of Dr. Allena Katz.

## 2023-10-07 NOTE — Assessment & Plan Note (Signed)
  Currently prescribed Clomid by [facility]. Patient considering asking Dr. Allena Katz to take over prescription. -Continue current Clomid regimen under the care of [facility] or Dr. Allena Katz as per patient's preference.

## 2023-10-07 NOTE — Assessment & Plan Note (Addendum)
 Stable on adderall xr.  Contract updated. Update UDS.

## 2023-10-09 LAB — DRUG MONITORING, PANEL 8 WITH CONFIRMATION, URINE
6 Acetylmorphine: NEGATIVE ng/mL (ref <10)
Alcohol Metabolites: NEGATIVE ng/mL (ref <500)
Amphetamine: 867 ng/mL — ABNORMAL HIGH (ref <250)
Amphetamines: POSITIVE ng/mL — AB (ref <500)
Benzodiazepines: NEGATIVE ng/mL (ref <100)
Buprenorphine, Urine: NEGATIVE ng/mL (ref <5)
Cocaine Metabolite: NEGATIVE ng/mL (ref <150)
Creatinine: 65.3 mg/dL (ref >=20.0)
MDMA: NEGATIVE ng/mL (ref <500)
Marijuana Metabolite: NEGATIVE ng/mL (ref <20)
Methamphetamine: NEGATIVE ng/mL (ref <250)
Opiates: NEGATIVE ng/mL (ref <100)
Oxidant: NEGATIVE ug/mL (ref <200)
Oxycodone: NEGATIVE ng/mL (ref <100)
pH: 6.7 (ref 4.5–9.0)

## 2023-10-09 LAB — DM TEMPLATE

## 2023-10-13 ENCOUNTER — Telehealth: Payer: Self-pay | Admitting: Family

## 2023-10-13 NOTE — Telephone Encounter (Signed)
 See mychart.

## 2023-10-16 DIAGNOSIS — F411 Generalized anxiety disorder: Secondary | ICD-10-CM | POA: Diagnosis not present

## 2023-10-18 DIAGNOSIS — C73 Malignant neoplasm of thyroid gland: Secondary | ICD-10-CM | POA: Diagnosis not present

## 2023-10-18 DIAGNOSIS — E890 Postprocedural hypothyroidism: Secondary | ICD-10-CM | POA: Diagnosis not present

## 2023-10-18 DIAGNOSIS — E291 Testicular hypofunction: Secondary | ICD-10-CM | POA: Diagnosis not present

## 2023-10-21 DIAGNOSIS — F411 Generalized anxiety disorder: Secondary | ICD-10-CM | POA: Diagnosis not present

## 2023-10-27 ENCOUNTER — Encounter: Payer: Self-pay | Admitting: Family

## 2023-10-30 ENCOUNTER — Other Ambulatory Visit: Payer: Self-pay | Admitting: Family

## 2023-10-30 ENCOUNTER — Other Ambulatory Visit (HOSPITAL_BASED_OUTPATIENT_CLINIC_OR_DEPARTMENT_OTHER): Payer: Self-pay

## 2023-10-30 DIAGNOSIS — F411 Generalized anxiety disorder: Secondary | ICD-10-CM | POA: Diagnosis not present

## 2023-10-30 DIAGNOSIS — F909 Attention-deficit hyperactivity disorder, unspecified type: Secondary | ICD-10-CM

## 2023-10-30 MED ORDER — AMPHETAMINE-DEXTROAMPHET ER 20 MG PO CP24
20.0000 mg | ORAL_CAPSULE | ORAL | 0 refills | Status: DC
Start: 2023-10-30 — End: 2023-11-28
  Filled 2023-10-30: qty 30, 30d supply, fill #0

## 2023-11-06 DIAGNOSIS — F411 Generalized anxiety disorder: Secondary | ICD-10-CM | POA: Diagnosis not present

## 2023-11-13 DIAGNOSIS — F411 Generalized anxiety disorder: Secondary | ICD-10-CM | POA: Diagnosis not present

## 2023-11-20 DIAGNOSIS — F411 Generalized anxiety disorder: Secondary | ICD-10-CM | POA: Diagnosis not present

## 2023-11-25 DIAGNOSIS — F411 Generalized anxiety disorder: Secondary | ICD-10-CM | POA: Diagnosis not present

## 2023-11-28 ENCOUNTER — Other Ambulatory Visit: Payer: Self-pay | Admitting: Family

## 2023-11-28 ENCOUNTER — Other Ambulatory Visit (HOSPITAL_BASED_OUTPATIENT_CLINIC_OR_DEPARTMENT_OTHER): Payer: Self-pay

## 2023-11-28 DIAGNOSIS — F909 Attention-deficit hyperactivity disorder, unspecified type: Secondary | ICD-10-CM

## 2023-11-28 MED ORDER — AMPHETAMINE-DEXTROAMPHET ER 20 MG PO CP24
20.0000 mg | ORAL_CAPSULE | ORAL | 0 refills | Status: DC
Start: 2023-11-28 — End: 2023-12-30
  Filled 2023-11-28: qty 30, 30d supply, fill #0

## 2023-11-28 NOTE — Telephone Encounter (Signed)
 Requesting: Adderall XR 20mg   Contract: 10/07/23 UDS: 10/07/23 Last Visit: 10/07/23 Next Visit: 04/10/24 Last Refill: 10/30/23 #30 and 0RF   Please Advise

## 2023-12-04 DIAGNOSIS — F411 Generalized anxiety disorder: Secondary | ICD-10-CM | POA: Diagnosis not present

## 2023-12-09 DIAGNOSIS — F411 Generalized anxiety disorder: Secondary | ICD-10-CM | POA: Diagnosis not present

## 2023-12-16 DIAGNOSIS — F411 Generalized anxiety disorder: Secondary | ICD-10-CM | POA: Diagnosis not present

## 2023-12-23 DIAGNOSIS — F411 Generalized anxiety disorder: Secondary | ICD-10-CM | POA: Diagnosis not present

## 2023-12-24 DIAGNOSIS — F411 Generalized anxiety disorder: Secondary | ICD-10-CM | POA: Diagnosis not present

## 2023-12-30 ENCOUNTER — Other Ambulatory Visit (HOSPITAL_BASED_OUTPATIENT_CLINIC_OR_DEPARTMENT_OTHER): Payer: Self-pay

## 2023-12-30 ENCOUNTER — Other Ambulatory Visit: Payer: Self-pay | Admitting: Family

## 2023-12-30 DIAGNOSIS — F909 Attention-deficit hyperactivity disorder, unspecified type: Secondary | ICD-10-CM

## 2023-12-30 MED ORDER — AMPHETAMINE-DEXTROAMPHET ER 20 MG PO CP24
20.0000 mg | ORAL_CAPSULE | ORAL | 0 refills | Status: DC
  Filled 2023-12-30: qty 30, 30d supply, fill #0

## 2024-01-13 DIAGNOSIS — E890 Postprocedural hypothyroidism: Secondary | ICD-10-CM | POA: Diagnosis not present

## 2024-01-13 DIAGNOSIS — C73 Malignant neoplasm of thyroid gland: Secondary | ICD-10-CM | POA: Diagnosis not present

## 2024-01-13 DIAGNOSIS — E291 Testicular hypofunction: Secondary | ICD-10-CM | POA: Diagnosis not present

## 2024-01-13 DIAGNOSIS — F411 Generalized anxiety disorder: Secondary | ICD-10-CM | POA: Diagnosis not present

## 2024-01-20 DIAGNOSIS — F411 Generalized anxiety disorder: Secondary | ICD-10-CM | POA: Diagnosis not present

## 2024-01-28 ENCOUNTER — Other Ambulatory Visit (HOSPITAL_BASED_OUTPATIENT_CLINIC_OR_DEPARTMENT_OTHER): Payer: Self-pay

## 2024-01-28 ENCOUNTER — Other Ambulatory Visit: Payer: Self-pay | Admitting: Family

## 2024-01-28 DIAGNOSIS — F909 Attention-deficit hyperactivity disorder, unspecified type: Secondary | ICD-10-CM

## 2024-01-28 MED ORDER — AMPHETAMINE-DEXTROAMPHET ER 20 MG PO CP24
20.0000 mg | ORAL_CAPSULE | ORAL | 0 refills | Status: DC
  Filled 2024-01-28: qty 30, 30d supply, fill #0

## 2024-02-07 DIAGNOSIS — E890 Postprocedural hypothyroidism: Secondary | ICD-10-CM | POA: Diagnosis not present

## 2024-02-07 DIAGNOSIS — E291 Testicular hypofunction: Secondary | ICD-10-CM | POA: Diagnosis not present

## 2024-02-07 DIAGNOSIS — C73 Malignant neoplasm of thyroid gland: Secondary | ICD-10-CM | POA: Diagnosis not present

## 2024-02-27 ENCOUNTER — Other Ambulatory Visit: Payer: Self-pay | Admitting: Family

## 2024-02-27 ENCOUNTER — Other Ambulatory Visit (HOSPITAL_BASED_OUTPATIENT_CLINIC_OR_DEPARTMENT_OTHER): Payer: Self-pay

## 2024-02-27 ENCOUNTER — Telehealth: Payer: Self-pay | Admitting: Family

## 2024-02-27 DIAGNOSIS — F909 Attention-deficit hyperactivity disorder, unspecified type: Secondary | ICD-10-CM

## 2024-02-27 MED ORDER — AMPHETAMINE-DEXTROAMPHET ER 20 MG PO CP24
20.0000 mg | ORAL_CAPSULE | ORAL | 0 refills | Status: DC
  Filled 2024-02-27: qty 30, 30d supply, fill #0

## 2024-02-27 NOTE — Telephone Encounter (Signed)
 Medication: Adderall Directions: Take 1 capsule (20 mg total) by mouth every morning.  Last given: 01/28/2024 Number refills: 0 Last o/v: 10/07/2023 Follow up: NA Labs: 02/07/2024

## 2024-02-27 NOTE — Telephone Encounter (Signed)
 See mychart.

## 2024-03-04 DIAGNOSIS — C73 Malignant neoplasm of thyroid gland: Secondary | ICD-10-CM | POA: Diagnosis not present

## 2024-03-04 DIAGNOSIS — E291 Testicular hypofunction: Secondary | ICD-10-CM | POA: Diagnosis not present

## 2024-03-25 ENCOUNTER — Ambulatory Visit: Admitting: Family

## 2024-03-25 ENCOUNTER — Other Ambulatory Visit (HOSPITAL_BASED_OUTPATIENT_CLINIC_OR_DEPARTMENT_OTHER): Payer: Self-pay

## 2024-03-25 VITALS — BP 131/87 | HR 73 | Temp 98.0°F | Resp 16 | Ht 70.0 in | Wt 207.0 lb

## 2024-03-25 DIAGNOSIS — F909 Attention-deficit hyperactivity disorder, unspecified type: Secondary | ICD-10-CM

## 2024-03-25 DIAGNOSIS — Z85850 Personal history of malignant neoplasm of thyroid: Secondary | ICD-10-CM

## 2024-03-25 DIAGNOSIS — E291 Testicular hypofunction: Secondary | ICD-10-CM

## 2024-03-25 MED ORDER — AMPHETAMINE-DEXTROAMPHET ER 20 MG PO CP24
20.0000 mg | ORAL_CAPSULE | ORAL | 0 refills | Status: DC
  Filled 2024-03-25 – 2024-03-27 (×3): qty 30, 30d supply, fill #0

## 2024-03-25 NOTE — Progress Notes (Signed)
 Subjective:     Patient ID: Zachary [name], male    DOB: [birth year], 48 y.o.   MRN: [id number]  Chief Complaint  Patient presents with   ADHD    Here for follow up    HPI  Discussed the use of AI scribe software for clinical note transcription with the patient, who gave verbal consent to proceed.  History of Present Illness Zachary [name] is a 48 year old male who presents for a six-month follow-up of his ADHD medication.  He is stable on his current Adderall regimen, taking it early in the morning. He maintains focus throughout his workday, which starts at 7:30 AM and ends around 4:30-5:00 PM. He does not require an additional afternoon dose, as he manages well with the current dosage.  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      Health Maintenance Due  Topic Date Due   Hepatitis B Vaccines 19-59 Average Risk (1 of 3 - 19+ 3-dose series) Never done    Past Medical History:  Diagnosis Date   ADD (attention deficit disorder)    History of papillary adenocarcinoma of thyroid     s/p thyroidectomy 06/2003   History of tobacco abuse    Hypertriglyceridemia 06/03/2019   Iatrogenic hyperthyroidism     Past Surgical History:  Procedure Laterality Date   THYROIDECTOMY  06/2003   TONSILLECTOMY  1985    Family History  Problem Relation Age of Onset   Other Mother 38       healthy   Hypertension Mother    Achalasia Father 56   Stroke Maternal Grandfather    Coronary artery disease Paternal Grandmother    Diabetes Neg Hx    Colon cancer Neg Hx    Colon polyps Neg Hx    Esophageal cancer Neg Hx    Rectal cancer Neg Hx    Stomach cancer Neg Hx     Social History   Socioeconomic History   Marital status: Married    Spouse name: Not on file   Number of children: Not on file   Years of education: Not on file   Highest education level: Bachelor's degree (e.g., BA, AB, BS)   Occupational History   Not on file  Tobacco Use   Smoking status: Former   Smokeless tobacco: Never   Tobacco comments:    Quit  about 7 years ago. 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   Plays soccer, guitar   Social Drivers of Health   Financial Resource Strain: Low Risk  (03/25/2024)   Overall Financial Resource Strain (CARDIA)    Difficulty of Paying Living Expenses: Not hard at all  Food Insecurity: No Food Insecurity (03/25/2024)   Hunger Vital Sign    Worried About Running Out of Food in the Last  Year: Never true    Ran Out of Food in the Last Year: Never true  Transportation Needs: No Transportation Needs (03/25/2024)   PRAPARE - Administrator, Civil Service (Medical): No    Lack of Transportation (Non-Medical): No  Physical Activity: Insufficiently Active (03/25/2024)   Exercise Vital Sign    Days of Exercise per Week: 2 days    Minutes of Exercise per Session: 30 min  Stress: Stress Concern Present (03/25/2024)   [employer] of Occupational Health - Occupational Stress Questionnaire    Feeling of Stress: To some extent  Social Connections: Socially Integrated (03/25/2024)   Social Connection and Isolation Panel    Frequency of Communication with Friends and Family: Twice a week    Frequency of Social Gatherings with Friends and Family: Twice a week    Attends Religious Services: More than 4 times per year    Active Member of [employer] or Organizations: Yes    Attends Engineer, structural: More than 4 times  per year    Marital Status: Married  Catering manager Violence: Not on file    Outpatient Medications Prior to Visit  Medication Sig Dispense Refill   levothyroxine  (SYNTHROID , LEVOTHROID) 200 MCG tablet Take 175 mcg by mouth daily.     Omega-3 Fatty Acids (FISH OIL ) 1000 MG CAPS Take 3 capsules (3,000 mg total) by mouth in the morning and at bedtime.  0   Testosterone  20.25 MG/ACT (1.62%) GEL SMARTSIG:1 pump Topical Daily     amphetamine -dextroamphetamine  (ADDERALL XR) 20 MG 24 hr capsule Take 1 capsule (20 mg total) by mouth every morning. 30 capsule 0   clomiPHENE (CLOMID) 50 MG tablet Take 25 mg by mouth every other day.     sildenafil  (REVATIO ) 20 MG tablet SMARTSIG:1-5 Tablet(s) By Mouth Daily PRN (Patient not taking: Reported on 03/25/2024)     No facility-administered medications prior to visit.    No Known Allergies  ROS    See HPI Objective:    Physical Exam Constitutional:      General: He is not in acute distress.    Appearance: He is well-developed.  HENT:     Head: Normocephalic and atraumatic.  Cardiovascular:     Rate and Rhythm: Normal rate and regular rhythm.     Heart sounds: No murmur heard. Pulmonary:     Effort: Pulmonary effort is normal. No respiratory distress.     Breath sounds: Normal breath sounds. No wheezing or rales.  Skin:    General: Skin is warm and dry.  Neurological:     Mental Status: He is alert and oriented to person, place, and time.  Psychiatric:        Behavior: Behavior normal.        Thought Content: Thought content normal.      BP 131/87 (BP Location: Left Arm, Patient Position: Sitting, Cuff Size: Large)   Pulse 73   Temp 98 F (36.7 C) (Oral)   Resp 16   Ht 5' 10 (1.778 m)   Wt 207 lb (93.9 kg)   SpO2 98%   BMI 29.70 kg/m  Wt Readings from Last 3 Encounters:  03/25/24 207 lb (93.9 kg)  10/07/23 210 lb (95.3 kg)  05/02/23 203 lb (92.1 kg)       Assessment & Plan:   Problem List Items Addressed This Visit        Unprioritized   THYROID  CANCER, HX OF   He continues to follow with endocrinology for  thyroid  management.  Maintained on synthroid .       Hypogonadism in male   His testosterone  therapy has been taken over by his endocrinologist- he is no longer following with Urology.       Attention deficit disorder - Primary   Stable on Adderall XR.  Continue same, contract is up to date.       Relevant Medications   amphetamine -dextroamphetamine  (ADDERALL XR) 20 MG 24 hr capsule    I have discontinued Waldon B. Manton Brad's clomiPHENE and sildenafil . I am also having him maintain his levothyroxine , Fish Oil , Testosterone , and amphetamine -dextroamphetamine .  Meds ordered this encounter  Medications   amphetamine -dextroamphetamine  (ADDERALL XR) 20 MG 24 hr capsule    Sig: Take 1 capsule (20 mg total) by mouth every morning.    Dispense:  30 capsule    Refill:  0    Supervising Provider:   DOMENICA BLACKBIRD A [[id number]]

## 2024-03-25 NOTE — Assessment & Plan Note (Signed)
 Stable on Adderall XR.  Continue same, contract is up to date.

## 2024-03-26 NOTE — Assessment & Plan Note (Signed)
 He continues to follow with endocrinology for thyroid  management.  Maintained on synthroid .

## 2024-03-26 NOTE — Assessment & Plan Note (Signed)
 His testosterone  therapy has been taken over by his endocrinologist- he is no longer following with Urology.

## 2024-03-27 ENCOUNTER — Other Ambulatory Visit (HOSPITAL_BASED_OUTPATIENT_CLINIC_OR_DEPARTMENT_OTHER): Payer: Self-pay

## 2024-04-10 ENCOUNTER — Encounter: Payer: BC Managed Care – PPO | Admitting: Family

## 2024-04-27 ENCOUNTER — Other Ambulatory Visit: Payer: Self-pay

## 2024-04-27 ENCOUNTER — Other Ambulatory Visit (HOSPITAL_BASED_OUTPATIENT_CLINIC_OR_DEPARTMENT_OTHER): Payer: Self-pay

## 2024-04-27 ENCOUNTER — Other Ambulatory Visit: Payer: Self-pay | Admitting: Family

## 2024-04-27 DIAGNOSIS — F909 Attention-deficit hyperactivity disorder, unspecified type: Secondary | ICD-10-CM

## 2024-04-27 MED ORDER — AMPHETAMINE-DEXTROAMPHET ER 20 MG PO CP24
20.0000 mg | ORAL_CAPSULE | ORAL | 0 refills | Status: DC
  Filled 2024-04-27: qty 30, 30d supply, fill #0

## 2024-04-27 NOTE — Telephone Encounter (Signed)
 Requesting: Adderall XR 20mg   Contract:10/07/23 UDS: 10/07/23 Last Visit: 03/25/24 Next Visit: 10/09/24 Last Refill: 03/25/24 #30 and 0RF   Please Advise

## 2024-05-27 ENCOUNTER — Other Ambulatory Visit (HOSPITAL_BASED_OUTPATIENT_CLINIC_OR_DEPARTMENT_OTHER): Payer: Self-pay

## 2024-05-27 ENCOUNTER — Other Ambulatory Visit: Payer: Self-pay | Admitting: Family

## 2024-05-27 DIAGNOSIS — F909 Attention-deficit hyperactivity disorder, unspecified type: Secondary | ICD-10-CM

## 2024-05-27 MED ORDER — AMPHETAMINE-DEXTROAMPHET ER 20 MG PO CP24
20.0000 mg | ORAL_CAPSULE | ORAL | 0 refills | Status: DC
  Filled 2024-05-27: qty 10, 10d supply, fill #0
  Filled 2024-05-27: qty 20, 20d supply, fill #0
  Filled 2024-05-27: qty 30, 30d supply, fill #0

## 2024-05-27 NOTE — Telephone Encounter (Signed)
 Requesting: Adderall XR 20mg   Contract: 10/07/23 UDS: 10/07/23  Last Visit: 03/25/24 Next Visit: 10/09/24 Last Refill: 04/27/24 #30 and 0RF   Please Advise

## 2024-06-17 ENCOUNTER — Telehealth: Admitting: Physician Assistant

## 2024-06-17 DIAGNOSIS — J208 Acute bronchitis due to other specified organisms: Secondary | ICD-10-CM

## 2024-06-17 DIAGNOSIS — B9689 Other specified bacterial agents as the cause of diseases classified elsewhere: Secondary | ICD-10-CM

## 2024-06-17 MED ORDER — AZITHROMYCIN 250 MG PO TABS: ORAL_TABLET | ORAL | 0 refills | Status: AC

## 2024-06-17 MED ORDER — BENZONATATE 100 MG PO CAPS: 100.0000 mg | ORAL_CAPSULE | Freq: Three times a day (TID) | ORAL | 0 refills | Status: AC | PRN

## 2024-06-17 NOTE — Progress Notes (Signed)
 We are sorry that you are not feeling well.  Here is how we plan to help!  Based on your presentation I believe you most likely have A cough due to bacteria.  When patients have a fever and a productive cough with a change in color or increased sputum production, we are concerned about bacterial bronchitis.  If left untreated it can progress to pneumonia.  If your symptoms do not improve with your treatment plan it is important that you contact your provider.   I have prescribed Azithromyin 250 mg: two tablets now and then one tablet daily for 4 additonal days    In addition you may use A prescription cough medication called Tessalon Perles 100mg . You may take 1-2 capsules every 8 hours as needed for your cough.  From your responses in the eVisit questionnaire you describe inflammation in the upper respiratory tract which is causing a significant cough.  This is commonly called Bronchitis and has four common causes:   Allergies Viral Infections Acid Reflux Bacterial Infection Allergies, viruses and acid reflux are treated by controlling symptoms or eliminating the cause. An example might be a cough caused by taking certain blood pressure medications. You stop the cough by changing the medication. Another example might be a cough caused by acid reflux. Controlling the reflux helps control the cough.     HOME CARE Only take medications as instructed by your medical team. Complete the entire course of an antibiotic. Drink plenty of fluids and get plenty of rest. Avoid close contacts especially the very young and the elderly Cover your mouth if you cough or cough into your sleeve. Always remember to wash your hands A steam or ultrasonic humidifier can help congestion.   GET HELP RIGHT AWAY IF: You develop worsening fever. You become short of breath You cough up blood. Your symptoms persist after you have completed your treatment plan MAKE SURE YOU  Understand these instructions. Will watch  your condition. Will get help right away if you are not doing well or get worse.  Your e-visit answers were reviewed by a board certified advanced clinical practitioner to complete your personal care plan.  Depending on the condition, your plan could have included both over the counter or prescription medications. If there is a problem please reply  once you have received a response from your provider. Your safety is important to us .  If you have drug allergies check your prescription carefully.    You can use MyChart to ask questions about today's visit, request a non-urgent call back, or ask for a work or school excuse for 24 hours related to this e-Visit. If it has been greater than 24 hours you will need to follow up with your provider, or enter a new e-Visit to address those concerns. You will get an e-mail in the next two days asking about your experience.  I hope that your e-visit has been valuable and will speed your recovery. Thank you for using e-visits.   I have spent 5 minutes in review of e-visit questionnaire, review and updating patient chart, medical decision making and response to patient.   Delon CHRISTELLA Dickinson, PA-C

## 2024-06-18 DIAGNOSIS — F411 Generalized anxiety disorder: Secondary | ICD-10-CM | POA: Diagnosis not present

## 2024-06-18 DIAGNOSIS — E890 Postprocedural hypothyroidism: Secondary | ICD-10-CM | POA: Diagnosis not present

## 2024-06-18 DIAGNOSIS — E291 Testicular hypofunction: Secondary | ICD-10-CM | POA: Diagnosis not present

## 2024-06-18 DIAGNOSIS — C73 Malignant neoplasm of thyroid gland: Secondary | ICD-10-CM | POA: Diagnosis not present

## 2024-06-25 ENCOUNTER — Other Ambulatory Visit (HOSPITAL_BASED_OUTPATIENT_CLINIC_OR_DEPARTMENT_OTHER): Payer: Self-pay

## 2024-06-25 ENCOUNTER — Other Ambulatory Visit: Payer: Self-pay

## 2024-06-25 ENCOUNTER — Other Ambulatory Visit: Payer: Self-pay | Admitting: Family

## 2024-06-25 DIAGNOSIS — F909 Attention-deficit hyperactivity disorder, unspecified type: Secondary | ICD-10-CM

## 2024-06-25 DIAGNOSIS — F411 Generalized anxiety disorder: Secondary | ICD-10-CM | POA: Diagnosis not present

## 2024-06-25 MED ORDER — AMPHETAMINE-DEXTROAMPHET ER 20 MG PO CP24
20.0000 mg | ORAL_CAPSULE | ORAL | 0 refills | Status: DC
  Filled 2024-06-25: qty 30, 30d supply, fill #0

## 2024-06-25 NOTE — Telephone Encounter (Signed)
 Requesting: Adderall XR 20mg   Contract:10/07/23 UDS:10/07/23 Last Visit: 03/25/24 Next Visit:10/09/24 Last Refill: 05/27/24 #30 and 0RF   Please Advise

## 2024-07-02 DIAGNOSIS — F411 Generalized anxiety disorder: Secondary | ICD-10-CM | POA: Diagnosis not present

## 2024-07-16 DIAGNOSIS — F411 Generalized anxiety disorder: Secondary | ICD-10-CM | POA: Diagnosis not present

## 2024-07-27 ENCOUNTER — Other Ambulatory Visit: Payer: Self-pay | Admitting: Family

## 2024-07-27 ENCOUNTER — Other Ambulatory Visit (HOSPITAL_BASED_OUTPATIENT_CLINIC_OR_DEPARTMENT_OTHER): Payer: Self-pay

## 2024-07-27 DIAGNOSIS — F909 Attention-deficit hyperactivity disorder, unspecified type: Secondary | ICD-10-CM

## 2024-07-27 MED ORDER — AMPHETAMINE-DEXTROAMPHET ER 20 MG PO CP24
20.0000 mg | ORAL_CAPSULE | ORAL | 0 refills | Status: DC
  Filled 2024-07-27: qty 30, 30d supply, fill #0

## 2024-07-30 DIAGNOSIS — F411 Generalized anxiety disorder: Secondary | ICD-10-CM | POA: Diagnosis not present

## 2024-08-26 ENCOUNTER — Other Ambulatory Visit: Payer: Self-pay | Admitting: Family

## 2024-08-26 DIAGNOSIS — F909 Attention-deficit hyperactivity disorder, unspecified type: Secondary | ICD-10-CM

## 2024-08-26 NOTE — Telephone Encounter (Signed)
 Requesting: Adderall XR 20mg  Contract:10/07/23 UDS:10/07/23 Last Visit: 03/25/24 Next Visit: 10/09/24 Last Refill: 07/27/24 #30 and 0RF   Please Advise

## 2024-08-27 ENCOUNTER — Encounter (HOSPITAL_BASED_OUTPATIENT_CLINIC_OR_DEPARTMENT_OTHER): Payer: Self-pay

## 2024-08-27 ENCOUNTER — Other Ambulatory Visit (HOSPITAL_BASED_OUTPATIENT_CLINIC_OR_DEPARTMENT_OTHER): Payer: Self-pay

## 2024-08-27 MED ORDER — AMPHETAMINE-DEXTROAMPHET ER 20 MG PO CP24
20.0000 mg | ORAL_CAPSULE | ORAL | 0 refills | Status: AC
  Filled 2024-08-27: qty 30, 30d supply, fill #0

## 2024-10-09 ENCOUNTER — Encounter: Admitting: Family
